# Patient Record
Sex: Male | Born: 1945 | Race: White | Hispanic: No | Marital: Married | Smoking: Former smoker
Health system: Southern US, Community
[De-identification: ages and names within clinical notes are randomized; demographics above are authoritative.]

## PROBLEM LIST (undated history)

## (undated) DIAGNOSIS — Z87442 Personal history of urinary calculi: Secondary | ICD-10-CM

## (undated) DIAGNOSIS — I10 Essential (primary) hypertension: Secondary | ICD-10-CM

## (undated) DIAGNOSIS — K219 Gastro-esophageal reflux disease without esophagitis: Secondary | ICD-10-CM

## (undated) DIAGNOSIS — M5416 Radiculopathy, lumbar region: Secondary | ICD-10-CM

## (undated) DIAGNOSIS — R011 Cardiac murmur, unspecified: Secondary | ICD-10-CM

## (undated) DIAGNOSIS — H9319 Tinnitus, unspecified ear: Secondary | ICD-10-CM

## (undated) DIAGNOSIS — D126 Benign neoplasm of colon, unspecified: Secondary | ICD-10-CM

## (undated) DIAGNOSIS — K2270 Barrett's esophagus without dysplasia: Secondary | ICD-10-CM

## (undated) DIAGNOSIS — K769 Liver disease, unspecified: Secondary | ICD-10-CM

## (undated) DIAGNOSIS — M1990 Unspecified osteoarthritis, unspecified site: Secondary | ICD-10-CM

## (undated) DIAGNOSIS — E785 Hyperlipidemia, unspecified: Secondary | ICD-10-CM

## (undated) DIAGNOSIS — T7840XA Allergy, unspecified, initial encounter: Secondary | ICD-10-CM

## (undated) DIAGNOSIS — K7581 Nonalcoholic steatohepatitis (NASH): Secondary | ICD-10-CM

## (undated) DIAGNOSIS — D696 Thrombocytopenia, unspecified: Secondary | ICD-10-CM

## (undated) DIAGNOSIS — K2210 Ulcer of esophagus without bleeding: Secondary | ICD-10-CM

## (undated) DIAGNOSIS — K7460 Unspecified cirrhosis of liver: Secondary | ICD-10-CM

## (undated) DIAGNOSIS — R7303 Prediabetes: Secondary | ICD-10-CM

## (undated) DIAGNOSIS — Z87891 Personal history of nicotine dependence: Secondary | ICD-10-CM

## (undated) HISTORY — DX: Allergy, unspecified, initial encounter: T78.40XA

## (undated) HISTORY — DX: Benign neoplasm of colon, unspecified: D12.6

## (undated) HISTORY — DX: Hyperlipidemia, unspecified: E78.5

## (undated) HISTORY — DX: Personal history of urinary calculi: Z87.442

## (undated) HISTORY — DX: Tinnitus, unspecified ear: H93.19

## (undated) HISTORY — PX: UPPER GASTROINTESTINAL ENDOSCOPY: SHX188

## (undated) HISTORY — DX: Liver disease, unspecified: K76.9

## (undated) HISTORY — DX: Barrett's esophagus without dysplasia: K22.70

## (undated) HISTORY — DX: Gastro-esophageal reflux disease without esophagitis: K21.9

## (undated) HISTORY — DX: Cardiac murmur, unspecified: R01.1

## (undated) HISTORY — DX: Essential (primary) hypertension: I10

## (undated) HISTORY — DX: Unspecified cirrhosis of liver: K74.60

## (undated) HISTORY — DX: Ulcer of esophagus without bleeding: K22.10

## (undated) HISTORY — DX: Personal history of nicotine dependence: Z87.891

## (undated) HISTORY — DX: Unspecified osteoarthritis, unspecified site: M19.90

## (undated) HISTORY — DX: Thrombocytopenia, unspecified: D69.6

## (undated) HISTORY — DX: Nonalcoholic steatohepatitis (NASH): K75.81

## (undated) HISTORY — DX: Radiculopathy, lumbar region: M54.16

## (undated) HISTORY — DX: Prediabetes: R73.03

---

## 1986-11-29 DIAGNOSIS — M5416 Radiculopathy, lumbar region: Secondary | ICD-10-CM

## 1986-11-29 HISTORY — DX: Radiculopathy, lumbar region: M54.16

## 1993-11-29 DIAGNOSIS — Z87891 Personal history of nicotine dependence: Secondary | ICD-10-CM

## 1993-11-29 HISTORY — DX: Personal history of nicotine dependence: Z87.891

## 1994-11-29 HISTORY — PX: CHOLECYSTECTOMY: SHX55

## 2006-11-29 DIAGNOSIS — Z87442 Personal history of urinary calculi: Secondary | ICD-10-CM

## 2006-11-29 DIAGNOSIS — R7303 Prediabetes: Secondary | ICD-10-CM

## 2006-11-29 HISTORY — DX: Prediabetes: R73.03

## 2006-11-29 HISTORY — DX: Personal history of urinary calculi: Z87.442

## 2011-11-30 HISTORY — PX: COLONOSCOPY: SHX174

## 2012-08-31 DIAGNOSIS — D126 Benign neoplasm of colon, unspecified: Secondary | ICD-10-CM | POA: Diagnosis not present

## 2012-08-31 DIAGNOSIS — K7460 Unspecified cirrhosis of liver: Secondary | ICD-10-CM | POA: Diagnosis not present

## 2012-08-31 DIAGNOSIS — Z860100 Personal history of colon polyps, unspecified: Secondary | ICD-10-CM | POA: Diagnosis not present

## 2012-08-31 DIAGNOSIS — K7689 Other specified diseases of liver: Secondary | ICD-10-CM | POA: Diagnosis not present

## 2013-02-21 DIAGNOSIS — Z125 Encounter for screening for malignant neoplasm of prostate: Secondary | ICD-10-CM | POA: Diagnosis not present

## 2013-02-21 DIAGNOSIS — Z136 Encounter for screening for cardiovascular disorders: Secondary | ICD-10-CM | POA: Diagnosis not present

## 2013-02-21 DIAGNOSIS — E119 Type 2 diabetes mellitus without complications: Secondary | ICD-10-CM | POA: Diagnosis not present

## 2013-02-22 ENCOUNTER — Other Ambulatory Visit: Payer: Self-pay | Admitting: *Deleted

## 2013-02-22 DIAGNOSIS — K7460 Unspecified cirrhosis of liver: Secondary | ICD-10-CM

## 2013-02-22 DIAGNOSIS — E119 Type 2 diabetes mellitus without complications: Secondary | ICD-10-CM | POA: Diagnosis not present

## 2013-02-22 DIAGNOSIS — Z125 Encounter for screening for malignant neoplasm of prostate: Secondary | ICD-10-CM | POA: Diagnosis not present

## 2013-02-22 LAB — COMPREHENSIVE METABOLIC PANEL
ALK PHOS: 48 U/L
ALT: 63
AST: 39 U/L
Albumin: 4.6
Creat: 1.18
GLUCOSE: 110
Potassium: 4.8 mmol/L
Sodium: 140 mmol/L (ref 137–147)
Total Bilirubin: 0.5 mg/dL
Total Protein: 7.1 g/dL

## 2013-02-22 LAB — LIPID PANEL
Cholesterol, Total: 207
HDL: 41 mg/dL (ref 35–70)
LDL (calc): 132
TRIGLYCERIDES: 171

## 2013-02-22 LAB — PSA: PSA: 0.2

## 2013-02-23 ENCOUNTER — Ambulatory Visit
Admission: RE | Admit: 2013-02-23 | Discharge: 2013-02-23 | Disposition: A | Discharge status: Home / Self Care | Payer: Self-pay | Source: Ambulatory Visit | Attending: *Deleted | Admitting: *Deleted

## 2013-02-23 DIAGNOSIS — K7689 Other specified diseases of liver: Secondary | ICD-10-CM | POA: Diagnosis not present

## 2013-02-23 DIAGNOSIS — K7460 Unspecified cirrhosis of liver: Secondary | ICD-10-CM

## 2013-02-26 DIAGNOSIS — I714 Abdominal aortic aneurysm, without rupture: Secondary | ICD-10-CM | POA: Diagnosis not present

## 2013-02-27 DIAGNOSIS — E119 Type 2 diabetes mellitus without complications: Secondary | ICD-10-CM | POA: Diagnosis not present

## 2013-02-27 DIAGNOSIS — K7460 Unspecified cirrhosis of liver: Secondary | ICD-10-CM | POA: Diagnosis not present

## 2013-02-27 DIAGNOSIS — E785 Hyperlipidemia, unspecified: Secondary | ICD-10-CM | POA: Diagnosis not present

## 2013-03-02 DIAGNOSIS — K7460 Unspecified cirrhosis of liver: Secondary | ICD-10-CM | POA: Diagnosis not present

## 2013-03-02 DIAGNOSIS — K7689 Other specified diseases of liver: Secondary | ICD-10-CM | POA: Diagnosis not present

## 2013-03-02 DIAGNOSIS — R932 Abnormal findings on diagnostic imaging of liver and biliary tract: Secondary | ICD-10-CM | POA: Diagnosis not present

## 2013-03-27 DIAGNOSIS — E785 Hyperlipidemia, unspecified: Secondary | ICD-10-CM | POA: Diagnosis not present

## 2013-03-30 DIAGNOSIS — K7460 Unspecified cirrhosis of liver: Secondary | ICD-10-CM | POA: Diagnosis not present

## 2013-03-30 DIAGNOSIS — K921 Melena: Secondary | ICD-10-CM | POA: Diagnosis not present

## 2013-06-05 DIAGNOSIS — E119 Type 2 diabetes mellitus without complications: Secondary | ICD-10-CM | POA: Diagnosis not present

## 2013-06-05 DIAGNOSIS — E785 Hyperlipidemia, unspecified: Secondary | ICD-10-CM | POA: Diagnosis not present

## 2013-06-21 DIAGNOSIS — M533 Sacrococcygeal disorders, not elsewhere classified: Secondary | ICD-10-CM | POA: Diagnosis not present

## 2013-06-21 DIAGNOSIS — IMO0001 Reserved for inherently not codable concepts without codable children: Secondary | ICD-10-CM | POA: Diagnosis not present

## 2013-06-21 DIAGNOSIS — M545 Low back pain, unspecified: Secondary | ICD-10-CM | POA: Diagnosis not present

## 2013-07-18 DIAGNOSIS — Z7189 Other specified counseling: Secondary | ICD-10-CM | POA: Diagnosis not present

## 2013-07-18 DIAGNOSIS — J209 Acute bronchitis, unspecified: Secondary | ICD-10-CM | POA: Diagnosis not present

## 2013-08-29 DIAGNOSIS — Z23 Encounter for immunization: Secondary | ICD-10-CM | POA: Diagnosis not present

## 2013-10-18 DIAGNOSIS — R160 Hepatomegaly, not elsewhere classified: Secondary | ICD-10-CM | POA: Diagnosis not present

## 2013-10-18 DIAGNOSIS — K7460 Unspecified cirrhosis of liver: Secondary | ICD-10-CM | POA: Diagnosis not present

## 2014-01-24 DIAGNOSIS — K7460 Unspecified cirrhosis of liver: Secondary | ICD-10-CM | POA: Diagnosis not present

## 2014-05-15 ENCOUNTER — Telehealth: Payer: Self-pay | Admitting: Gastroenterology

## 2014-05-24 NOTE — Telephone Encounter (Signed)
Records approved.  LM for patient.  He wants Oct appt.  Will call him back when schedule open.  Records in file.

## 2014-06-26 ENCOUNTER — Encounter: Payer: Self-pay | Admitting: Gastroenterology

## 2014-08-29 DIAGNOSIS — K2270 Barrett's esophagus without dysplasia: Secondary | ICD-10-CM

## 2014-08-29 HISTORY — PX: ESOPHAGOGASTRODUODENOSCOPY: SHX1529

## 2014-08-29 HISTORY — DX: Barrett's esophagus without dysplasia: K22.70

## 2014-08-30 DIAGNOSIS — Z23 Encounter for immunization: Secondary | ICD-10-CM | POA: Diagnosis not present

## 2014-09-13 ENCOUNTER — Encounter: Payer: Self-pay | Admitting: Gastroenterology

## 2014-09-18 ENCOUNTER — Encounter: Payer: Self-pay | Admitting: Gastroenterology

## 2014-09-18 ENCOUNTER — Other Ambulatory Visit (INDEPENDENT_AMBULATORY_CARE_PROVIDER_SITE_OTHER): Payer: Medicare Other

## 2014-09-18 ENCOUNTER — Ambulatory Visit (INDEPENDENT_AMBULATORY_CARE_PROVIDER_SITE_OTHER): Payer: Medicare Other | Admitting: Gastroenterology

## 2014-09-18 VITALS — BP 142/82 | HR 64 | Ht 69.5 in | Wt 221.5 lb

## 2014-09-18 DIAGNOSIS — K7460 Unspecified cirrhosis of liver: Secondary | ICD-10-CM | POA: Diagnosis not present

## 2014-09-18 DIAGNOSIS — K7581 Nonalcoholic steatohepatitis (NASH): Secondary | ICD-10-CM | POA: Diagnosis not present

## 2014-09-18 DIAGNOSIS — K2270 Barrett's esophagus without dysplasia: Secondary | ICD-10-CM | POA: Insufficient documentation

## 2014-09-18 DIAGNOSIS — K7469 Other cirrhosis of liver: Secondary | ICD-10-CM | POA: Insufficient documentation

## 2014-09-18 DIAGNOSIS — Z86010 Personal history of colonic polyps: Secondary | ICD-10-CM

## 2014-09-18 HISTORY — DX: Nonalcoholic steatohepatitis (NASH): K75.81

## 2014-09-18 LAB — COMPREHENSIVE METABOLIC PANEL
ALT: 42 U/L (ref 0–53)
AST: 30 U/L (ref 0–37)
Albumin: 4 g/dL (ref 3.5–5.2)
Alkaline Phosphatase: 45 U/L (ref 39–117)
BUN: 24 mg/dL — ABNORMAL HIGH (ref 6–23)
CO2: 31 mEq/L (ref 19–32)
Calcium: 9.9 mg/dL (ref 8.4–10.5)
Chloride: 104 mEq/L (ref 96–112)
Creatinine, Ser: 1.3 mg/dL (ref 0.4–1.5)
GFR: 61.11 mL/min (ref >60.00)
Glucose, Bld: 103 mg/dL — ABNORMAL HIGH (ref 70–99)
POTASSIUM: 4.3 meq/L (ref 3.5–5.1)
SODIUM: 138 meq/L (ref 135–145)
TOTAL PROTEIN: 8.1 g/dL (ref 6.0–8.3)
Total Bilirubin: 1 mg/dL (ref 0.2–1.2)

## 2014-09-18 LAB — CBC WITH DIFFERENTIAL/PLATELET
BASOS PCT: 0.2 % (ref 0.0–3.0)
Basophils Absolute: 0 10*3/uL (ref 0.0–0.1)
EOS PCT: 3.4 % (ref 0.0–5.0)
Eosinophils Absolute: 0.2 10*3/uL (ref 0.0–0.7)
HCT: 43.4 % (ref 39.0–52.0)
Hemoglobin: 14.7 g/dL (ref 13.0–17.0)
LYMPHS PCT: 17.8 % (ref 12.0–46.0)
Lymphs Abs: 0.9 10*3/uL (ref 0.7–4.0)
MCHC: 33.8 g/dL (ref 30.0–36.0)
MCV: 87.8 fl (ref 78.0–100.0)
Monocytes Absolute: 0.5 10*3/uL (ref 0.1–1.0)
Monocytes Relative: 9 % (ref 3.0–12.0)
Neutro Abs: 3.5 10*3/uL (ref 1.4–7.7)
Neutrophils Relative %: 69.6 % (ref 43.0–77.0)
Platelets: 121 10*3/uL — ABNORMAL LOW (ref 150.0–400.0)
RBC: 4.95 Mil/uL (ref 4.22–5.81)
RDW: 14.4 % (ref 11.5–15.5)
WBC: 5.1 10*3/uL (ref 4.0–10.5)

## 2014-09-18 LAB — PROTIME-INR
INR: 1.1 ratio — ABNORMAL HIGH (ref 0.8–1.0)
Prothrombin Time: 12.3 s (ref 9.6–13.1)

## 2014-09-18 MED ORDER — OMEPRAZOLE 20 MG PO CPDR: 20.0000 mg | DELAYED_RELEASE_CAPSULE | Freq: Every day | ORAL | Status: DC

## 2014-09-18 NOTE — Assessment & Plan Note (Signed)
Prior endoscopy 2013.  Barrett's will be checked again while undergoing an endoscopy for surveillance of varices.  Plan to continue omeprazole.

## 2014-09-18 NOTE — Patient Instructions (Signed)
Go to the basement for labs today  You have been scheduled for an endoscopy. Please follow written instructions given to you at your visit today. If you use inhalers (even only as needed), please bring them with you on the day of your procedure. Your physician has requested that you go to www.startemmi.com and enter the access code given to you at your visit today. This web site gives a general overview about your procedure. However, you should still follow specific instructions given to you by our office regarding your preparation for the procedure. Your prescription will be renewed  (Omeprazole)

## 2014-09-18 NOTE — Progress Notes (Signed)
_                                                                                                                History of Present Illness:  Jeffery Pruitt 68 year old with [name] cirrhosis.  Adenomatous colon polyps were removed in 2013.  Barrett's esophagus was seen in 2013 as well.  In February, 2015 LFTs were normal.  Alpha-fetoprotein was 2.6.  In December, 2014 platelets were 123 and hemoglobin 14.3.  Ultrasound one year ago demonstrated hepatomegaly with hepatic steatosis.  Liver biopsy in 2009 demonstrated early cirrhotic parenchyma with prominent steatosis.  Patient's no GI complaints including abdominal pain or weight loss.  The only symptom of liver disease is thrombocytopenia.  He is received vaccinations for hepatitis A and B. and Pneumovax.    Past Medical History  Diagnosis Date  . NASH (nonalcoholic steatohepatitis)   . Cirrhosis   . Adenomatous colon polyp 2009 & 2013  . Barrett's esophagus   . Internal hemorrhoids   . Hyperplastic colon polyp   . Erosive esophagitis   . Chronic liver disease   . Gallstones 1996  . Hyperlipidemia   . Kidney stone 2008   Past Surgical History  Procedure Laterality Date  . Cholecystectomy  1996   family history includes Colon polyps in his father. There is no history of Colon cancer, Diabetes, Kidney disease, or Esophageal cancer. Current Outpatient Prescriptions  Medication Sig Dispense Refill  . atorvastatin (LIPITOR) 10 MG tablet Take 10 mg by mouth daily.      Marland Kitchen omeprazole (PRILOSEC) 20 MG capsule Take 20 mg by mouth daily.       No current facility-administered medications for this visit.   Allergies as of 09/18/2014  . (No Known Allergies)    reports that he quit smoking about 26 years ago. His smoking use included Cigarettes. He has a 40 pack-year smoking history. He has never used smokeless tobacco. He reports that he does not drink alcohol or use illicit drugs.   Review of Systems: Pertinent positive and  negative review of systems were noted in the above HPI section. All other review of systems were otherwise negative.  Vital signs were reviewed in today's medical record Physical Exam: General: Well developed , well nourished, no acute distress Skin: anicteric Head: Normocephalic and atraumatic Eyes:  sclerae anicteric, EOMI Ears: Normal auditory acuity Mouth: No deformity or lesions Neck: Supple, no masses or thyromegaly Lungs: Clear throughout to auscultation Heart: Regular rate and rhythm; no murmurs, rubs or bruits Abdomen: Soft, non tender and non distended. No masses, hepatosplenomegaly or hernias noted. Normal Bowel sounds Rectal:deferred Musculoskeletal: Symmetrical with no gross deformities  Skin: No lesions on visible extremities Pulses:  Normal pulses noted Extremities: No clubbing, cyanosis, edema or deformities noted Neurological: Alert oriented x 4, grossly nonfocal Cervical Nodes:  No significant cervical adenopathy Inguinal Nodes: No significant inguinal adenopathy Psychological:  Alert and cooperative. Normal mood and affect  See Assessment and Plan under Problem List

## 2014-09-18 NOTE — Assessment & Plan Note (Signed)
Excellent synthetic function.  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  Recommendations #1 upper endoscopy for surveillance of varices

## 2014-09-18 NOTE — Assessment & Plan Note (Signed)
Plan followup colonoscopy 2018 

## 2014-09-19 LAB — AFP TUMOR MARKER: AFP-Tumor Marker: 3.3 ng/mL (ref <6.1)

## 2014-09-23 ENCOUNTER — Ambulatory Visit (AMBULATORY_SURGERY_CENTER): Payer: Medicare Other | Admitting: Gastroenterology

## 2014-09-23 ENCOUNTER — Encounter: Payer: Self-pay | Admitting: Gastroenterology

## 2014-09-23 VITALS — BP 109/78 | HR 49 | Temp 97.4°F | Resp 14 | Ht 69.0 in | Wt 221.0 lb

## 2014-09-23 DIAGNOSIS — K2270 Barrett's esophagus without dysplasia: Secondary | ICD-10-CM

## 2014-09-23 DIAGNOSIS — K219 Gastro-esophageal reflux disease without esophagitis: Secondary | ICD-10-CM

## 2014-09-23 DIAGNOSIS — K7460 Unspecified cirrhosis of liver: Secondary | ICD-10-CM | POA: Diagnosis not present

## 2014-09-23 DIAGNOSIS — E669 Obesity, unspecified: Secondary | ICD-10-CM | POA: Diagnosis not present

## 2014-09-23 MED ORDER — SODIUM CHLORIDE 0.9 % IV SOLN: 500.0000 mL | INTRAVENOUS | Status: DC

## 2014-09-23 NOTE — Progress Notes (Signed)
Called to room to assist during endoscopic procedure.  Patient ID and intended procedure confirmed with present staff. Received instructions for my participation in the procedure from the performing physician.  

## 2014-09-23 NOTE — Patient Instructions (Signed)
YOU HAD AN ENDOSCOPIC PROCEDURE TODAY AT [facility]: Refer to the procedure report that was given to you for any specific questions about what was found during the examination.  If the procedure report does not answer your questions, please call your gastroenterologist to clarify.  If you requested that your care partner not be given the details of your procedure findings, then the procedure report has been included in a sealed envelope for you to review at your convenience later.  YOU SHOULD EXPECT: Some feelings of bloating in the abdomen. Passage of more gas than usual.  Walking can help get rid of the air that was put into your GI tract during the procedure and reduce the bloating. If you had a lower endoscopy (such as a colonoscopy or flexible sigmoidoscopy) you may notice spotting of blood in your stool or on the toilet paper. If you underwent a bowel prep for your procedure, then you may not have a normal bowel movement for a few days.  DIET: Your first meal following the procedure should be a light meal and then it is ok to progress to your normal diet.  A half-sandwich or bowl of soup is an example of a good first meal.  Heavy or fried foods are harder to digest and may make you feel nauseous or bloated.  Likewise meals heavy in dairy and vegetables can cause extra gas to form and this can also increase the bloating.  Drink plenty of fluids but you should avoid alcoholic beverages for 24 hours.  ACTIVITY: Your care partner should take you home directly after the procedure.  You should plan to take it easy, moving slowly for the rest of the day.  You can resume normal activity the day after the procedure however you should NOT DRIVE or use heavy machinery for 24 hours (because of the sedation medicines used during the test).    SYMPTOMS TO REPORT IMMEDIATELY: A gastroenterologist can be reached at any hour.  During normal business hours, 8:30 AM to 5:00 PM Monday through Friday,  call [phone].  After hours and on weekends, please call the GI answering service at [phone] who will take a message and have the physician on call contact you.    Following upper endoscopy (EGD)  Vomiting of blood or coffee ground material  New chest pain or pain under the shoulder blades  Painful or persistently difficult swallowing  New shortness of breath  Fever of 100F or higher  Black, tarry-looking stools  FOLLOW UP: If any biopsies were taken you will be contacted by phone or by letter within the next 1-3 weeks.  Call your gastroenterologist if you have not heard about the biopsies in 3 weeks.  Our staff will call the home number listed on your records the next business day following your procedure to check on you and address any questions or concerns that you may have at that time regarding the information given to you following your procedure. This is a courtesy call and so if there is no answer at the home number and we have not heard from you through the emergency physician on call, we will assume that you have returned to your regular daily activities without incident.  SIGNATURES/CONFIDENTIALITY: You and/or your care partner have signed paperwork which will be entered into your electronic medical record.  These signatures attest to the fact that that the information above on your After Visit Summary has been reviewed and is understood.  Full  responsibility of the confidentiality of this discharge information lies with you and/or your care-partner.  Wait biopsy results.  Repeat EGD in 1 year-2016.

## 2014-09-23 NOTE — Op Note (Signed)
[facility]  [address] [place], [id number]   ENDOSCOPY PROCEDURE REPORT  PATIENT: Jeffery, Pruitt  MR#: [id number] BIRTHDATE: 08/22/1946 , 11  yrs. old GENDER: male ENDOSCOPIST: Inda Castle, MD REFERRED BY:  Ria Bush, M.D. PROCEDURE DATE:  09/23/2014 PROCEDURE:  EGD w/ biopsy ASA CLASS:     Class II INDICATIONS:  history of Barrett's esophagus and screening for varices, h/o NASH MEDICATIONS: Monitored anesthesia care and Propofol 200 mg IV TOPICAL ANESTHETIC:  DESCRIPTION OF PROCEDURE: After the risks benefits and alternatives of the procedure were thoroughly explained, informed consent was obtained.  The LB [id number] endoscope was introduced through the mouth and advanced to the second portion of the duodenum , Without limitations.  The instrument was slowly withdrawn as the mucosa was fully examined.    ESOPHAGUS: There was evidence of Barrett's esophagus without dysplasia found at the gastroesophageal junction.  The length of circumferential Barrett's was 1cm ([place] C1).  Irregular GE junction.  Multiple biopsies were performed using cold forceps. NIX_THIS Description: NIX_THIS.   NIX_THIS NIX_THIS NIX_THIS Otherwise normal EGD  Retroflexed views revealed no abnormalities. The scope was then withdrawn from the patient and the procedure completed.  COMPLICATIONS: There were no immediate complications.  ENDOSCOPIC IMPRESSION: 1.   There was Barrett's esophagus w/o dysplasia found at the gastroesophageal junction; multiple biopsies were performed 2.   Otherwise normal EGD  RECOMMENDATIONS: Await biopsy results  REPEAT EXAM: 1 year eSigned:  Inda Castle, MD 09/23/2014 8:48 AM    CC:

## 2014-09-23 NOTE — Progress Notes (Signed)
Procedure ends, to recovery, report given and VSS. 

## 2014-09-24 ENCOUNTER — Ambulatory Visit (INDEPENDENT_AMBULATORY_CARE_PROVIDER_SITE_OTHER): Payer: Medicare Other | Admitting: Family Medicine

## 2014-09-24 ENCOUNTER — Encounter: Payer: Self-pay | Admitting: Family Medicine

## 2014-09-24 ENCOUNTER — Telehealth: Payer: Self-pay | Admitting: *Deleted

## 2014-09-24 VITALS — BP 138/82 | HR 68 | Temp 98.2°F | Ht 69.6 in | Wt 224.2 lb

## 2014-09-24 DIAGNOSIS — R7303 Prediabetes: Secondary | ICD-10-CM

## 2014-09-24 DIAGNOSIS — E785 Hyperlipidemia, unspecified: Secondary | ICD-10-CM

## 2014-09-24 DIAGNOSIS — M79603 Pain in arm, unspecified: Secondary | ICD-10-CM | POA: Insufficient documentation

## 2014-09-24 DIAGNOSIS — M5416 Radiculopathy, lumbar region: Secondary | ICD-10-CM | POA: Insufficient documentation

## 2014-09-24 DIAGNOSIS — M79601 Pain in right arm: Secondary | ICD-10-CM | POA: Diagnosis not present

## 2014-09-24 DIAGNOSIS — K7581 Nonalcoholic steatohepatitis (NASH): Secondary | ICD-10-CM

## 2014-09-24 DIAGNOSIS — K7469 Other cirrhosis of liver: Secondary | ICD-10-CM

## 2014-09-24 DIAGNOSIS — M79604 Pain in right leg: Secondary | ICD-10-CM

## 2014-09-24 DIAGNOSIS — Z23 Encounter for immunization: Secondary | ICD-10-CM

## 2014-09-24 DIAGNOSIS — R7309 Other abnormal glucose: Secondary | ICD-10-CM

## 2014-09-24 DIAGNOSIS — K2270 Barrett's esophagus without dysplasia: Secondary | ICD-10-CM

## 2014-09-24 MED ORDER — ATORVASTATIN CALCIUM 10 MG PO TABS: 10.0000 mg | ORAL_TABLET | Freq: Every day | ORAL | Status: DC

## 2014-09-24 NOTE — Assessment & Plan Note (Signed)
Has established with [facility] appreciate their care.

## 2014-09-24 NOTE — Addendum Note (Signed)
Addended by: Royann Shivers A on: 09/24/2014 03:51 PM   Modules accepted: Orders

## 2014-09-24 NOTE — Telephone Encounter (Signed)
  Follow up Call-  Call back number 09/23/2014  Post procedure Call Back phone  # [phone]  Permission to leave phone message Yes     Patient questions:  Do you have a fever, pain , or abdominal swelling? No. Pain Score  0 *  Have you tolerated food without any problems? Yes.    Have you been able to return to your normal activities? Yes.    Do you have any questions about your discharge instructions: Diet   No. Medications  No. Follow up visit  No.  Do you have questions or concerns about your Care? No.  Actions: * If pain score is 4 or above: No action needed, pain <4.

## 2014-09-24 NOTE — Progress Notes (Signed)
BP 138/82  Pulse 68  Temp(Src) 98.2 F (36.8 C) (Oral)  Ht 5' 9.6" (1.768 m)  Wt 224 lb 4 oz (101.719 kg)  BMI 32.54 kg/m2   CC: new pt to etablish care  Subjective:    Patient ID: Jeffery Pruitt, male    DOB: 02-23-1946, 68 y.o.   MRN: [id number]  HPI: Jeffery Pruitt is a 68 y.o. male presenting on 09/24/2014 for Establish Care   Prior saw MD in [place] then Dr Heber Sawyer for 1-2 years then she moved.   Recently established with Dr [facility] for NASH cirrhosis dx by biopsy 2009. Mild thrombocytopenia otherwise no other clinical or symptomatic liver manifestations. H/o alcohol use.   HLD - tolerating lipitor well without myalgias.   H/o diabetes type 2 - down to prediabetes after healthy diet/lifestyle changes.   Barrett's - s/p EGD yesterday, pending biopsy pathology.   R elbow pain - Stopped raquet sports. Ongoing for 8 months. Slowly improving.  Preventative: Last CPE 1.5 yrs ago Colonoscopy 2013 colon polyps, rpt due 5 yrs.  Prostate screening - gets this checked yearly.  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  Allergies and medications reviewed and updated. Current Outpatient Prescriptions on File Prior to Visit  Medication Sig  . omeprazole (PRILOSEC) 20 MG capsule Take 1 capsule (20 mg total) by mouth daily.   No current facility-administered medications on file prior to visit.    Review of Systems Per HPI unless specifically indicated above    Objective:    BP 138/82  Pulse 68  Temp(Src) 98.2 F (36.8 C) (Oral)  Ht 5' 9.6" (1.768 m)  Wt 224 lb 4 oz (101.719 kg)  BMI 32.54 kg/m2  Physical Exam  Nursing note  reviewed. Constitutional: He appears well-developed and well-nourished. No distress.  HENT:  Head: Normocephalic and atraumatic.  Mouth/Throat: Oropharynx is clear and moist. No oropharyngeal exudate.  Eyes: Conjunctivae and EOM are normal. Pupils are equal, round, and reactive to light. No scleral icterus.  Neck: Normal range of motion. Neck supple. No thyromegaly present.  Cardiovascular: Normal rate, regular rhythm, normal heart sounds and intact distal pulses.   No murmur heard. Pulmonary/Chest: Effort normal and breath sounds normal. No respiratory distress. He has no wheezes. He has no rales.  Abdominal: Soft. Bowel sounds are normal. He exhibits no distension and no mass. There is no tenderness. There is no rebound and no guarding.  Musculoskeletal: He exhibits no edema.  L arm WNL R arm - tender at insertion of distal triceps tendon, no pain at lateral or medial epicondyle. FROM at elbow flexion/extension  Lymphadenopathy:    He has no cervical adenopathy.  Skin: Skin is warm and dry. No rash noted.  Psychiatric: He has a normal mood and affect.       Assessment & Plan:   Problem List Items Addressed This Visit   Prediabetes     Check A1c next visit at medicare wellness visit.    NASH (nonalcoholic steatohepatitis)     Has established with [facility] appreciate their care.    Hyperlipidemia     Stable, tolerating lipitor. Check FLP next fasting labwork    Relevant Medications  atorvastatin (LIPITOR) tablet   Cirrhosis     Overall stable course - monitored closely by GI    Barrett's esophagus     S/p EGD yesterday - pending biopsy pathology. Compliant with low dose PPI daily.    Arm pain, posterior - Primary     Anticipate triceps tendinopathy - treat with aleve prn, ice/heating pad, and provided with stretching exercises from [facility] pt advisor. If not improved, suggested eval by SM Dr Lorelei Pont. Pt agrees with plan.        Follow up plan: Return in about 6  months (around 03/26/2015), or as needed, for medicare wellness visit.

## 2014-09-24 NOTE — Assessment & Plan Note (Signed)
Overall stable course - monitored closely by GI

## 2014-09-24 NOTE — Assessment & Plan Note (Signed)
Stable, tolerating lipitor. Check FLP next fasting labwork

## 2014-09-24 NOTE — Assessment & Plan Note (Signed)
Anticipate triceps tendinopathy - treat with aleve prn, ice/heating pad, and provided with stretching exercises from [facility] pt advisor. If not improved, suggested eval by SM Dr Lorelei Pont. Pt agrees with plan.

## 2014-09-24 NOTE — Assessment & Plan Note (Signed)
Check A1c next visit at medicare wellness visit.

## 2014-09-24 NOTE — Progress Notes (Signed)
Pre visit review using our clinic review tool, if applicable. No additional management support is needed unless otherwise documented below in the visit note. 

## 2014-09-24 NOTE — Patient Instructions (Addendum)
Prevnar today (pneumonia shot). For right arm - I wonder about tendonitis - treat with aleve as needed, ice or heating pad. Let me know if persistent pain. Good to see you today, call us with questions. Return as needed or in 6 months for wellness visit

## 2014-09-24 NOTE — Assessment & Plan Note (Addendum)
S/p EGD yesterday - pending biopsy pathology. Compliant with low dose PPI daily.

## 2014-09-25 ENCOUNTER — Encounter: Payer: Self-pay | Admitting: *Deleted

## 2014-09-25 DIAGNOSIS — H2513 Age-related nuclear cataract, bilateral: Secondary | ICD-10-CM | POA: Diagnosis not present

## 2014-09-27 ENCOUNTER — Encounter: Payer: Self-pay | Admitting: Gastroenterology

## 2014-09-30 ENCOUNTER — Encounter: Payer: Self-pay | Admitting: Family Medicine

## 2015-03-06 ENCOUNTER — Other Ambulatory Visit: Payer: Self-pay | Admitting: Family Medicine

## 2015-03-06 ENCOUNTER — Other Ambulatory Visit (INDEPENDENT_AMBULATORY_CARE_PROVIDER_SITE_OTHER): Payer: Medicare Other

## 2015-03-06 DIAGNOSIS — K7581 Nonalcoholic steatohepatitis (NASH): Secondary | ICD-10-CM

## 2015-03-06 DIAGNOSIS — Z125 Encounter for screening for malignant neoplasm of prostate: Secondary | ICD-10-CM

## 2015-03-06 DIAGNOSIS — K7469 Other cirrhosis of liver: Secondary | ICD-10-CM | POA: Diagnosis not present

## 2015-03-06 DIAGNOSIS — R7309 Other abnormal glucose: Secondary | ICD-10-CM | POA: Diagnosis not present

## 2015-03-06 DIAGNOSIS — E785 Hyperlipidemia, unspecified: Secondary | ICD-10-CM | POA: Diagnosis not present

## 2015-03-06 DIAGNOSIS — R7303 Prediabetes: Secondary | ICD-10-CM

## 2015-03-06 LAB — COMPREHENSIVE METABOLIC PANEL
ALK PHOS: 46 U/L (ref 39–117)
ALT: 83 U/L — ABNORMAL HIGH (ref 0–53)
AST: 41 U/L — ABNORMAL HIGH (ref 0–37)
Albumin: 4.5 g/dL (ref 3.5–5.2)
BUN: 23 mg/dL (ref 6–23)
CHLORIDE: 105 meq/L (ref 96–112)
CO2: 30 mEq/L (ref 19–32)
CREATININE: 1.19 mg/dL (ref 0.40–1.50)
Calcium: 9.6 mg/dL (ref 8.4–10.5)
GFR: 64.59 mL/min (ref >60.00)
Glucose, Bld: 110 mg/dL — ABNORMAL HIGH (ref 70–99)
Potassium: 4.4 mEq/L (ref 3.5–5.1)
Sodium: 141 mEq/L (ref 135–145)
Total Bilirubin: 0.7 mg/dL (ref 0.2–1.2)
Total Protein: 7.3 g/dL (ref 6.0–8.3)

## 2015-03-06 LAB — CBC WITH DIFFERENTIAL/PLATELET
BASOS PCT: 0.3 % (ref 0.0–3.0)
Basophils Absolute: 0 10*3/uL (ref 0.0–0.1)
EOS PCT: 3.4 % (ref 0.0–5.0)
Eosinophils Absolute: 0.1 10*3/uL (ref 0.0–0.7)
HEMATOCRIT: 43 % (ref 39.0–52.0)
Hemoglobin: 14.8 g/dL (ref 13.0–17.0)
Lymphocytes Relative: 24.6 % (ref 12.0–46.0)
Lymphs Abs: 0.9 10*3/uL (ref 0.7–4.0)
MCHC: 34.3 g/dL (ref 30.0–36.0)
MCV: 86.3 fl (ref 78.0–100.0)
Monocytes Absolute: 0.4 10*3/uL (ref 0.1–1.0)
Monocytes Relative: 9.2 % (ref 3.0–12.0)
NEUTROS PCT: 62.5 % (ref 43.0–77.0)
Neutro Abs: 2.4 10*3/uL (ref 1.4–7.7)
Platelets: 107 10*3/uL — ABNORMAL LOW (ref 150.0–400.0)
RBC: 4.99 Mil/uL (ref 4.22–5.81)
RDW: 14.3 % (ref 11.5–15.5)
WBC: 3.8 10*3/uL — ABNORMAL LOW (ref 4.0–10.5)

## 2015-03-06 LAB — PSA, MEDICARE: PSA: 0.19 ng/ml (ref 0.10–4.00)

## 2015-03-06 LAB — PROTIME-INR
INR: 1 ratio (ref 0.8–1.0)
PROTHROMBIN TIME: 11.5 s (ref 9.6–13.1)

## 2015-03-06 LAB — LIPID PANEL
Cholesterol: 218 mg/dL — ABNORMAL HIGH (ref 0–200)
HDL: 40 mg/dL (ref >39.00)
LDL Cholesterol: 143 mg/dL — ABNORMAL HIGH (ref 0–99)
NONHDL: 178
Total CHOL/HDL Ratio: 5
Triglycerides: 173 mg/dL — ABNORMAL HIGH (ref 0.0–149.0)
VLDL: 34.6 mg/dL (ref 0.0–40.0)

## 2015-03-06 LAB — TSH: TSH: 3.92 u[IU]/mL (ref 0.35–4.50)

## 2015-03-06 LAB — HEMOGLOBIN A1C: Hgb A1c MFr Bld: 6.1 % (ref 4.6–6.5)

## 2015-03-26 ENCOUNTER — Ambulatory Visit (INDEPENDENT_AMBULATORY_CARE_PROVIDER_SITE_OTHER): Payer: Medicare Other | Admitting: Family Medicine

## 2015-03-26 ENCOUNTER — Encounter: Payer: Self-pay | Admitting: Family Medicine

## 2015-03-26 VITALS — BP 124/76 | HR 60 | Temp 97.5°F | Ht 70.0 in | Wt 220.0 lb

## 2015-03-26 DIAGNOSIS — E785 Hyperlipidemia, unspecified: Secondary | ICD-10-CM | POA: Diagnosis not present

## 2015-03-26 DIAGNOSIS — K2270 Barrett's esophagus without dysplasia: Secondary | ICD-10-CM

## 2015-03-26 DIAGNOSIS — K7581 Nonalcoholic steatohepatitis (NASH): Secondary | ICD-10-CM

## 2015-03-26 DIAGNOSIS — Z7189 Other specified counseling: Secondary | ICD-10-CM | POA: Diagnosis not present

## 2015-03-26 DIAGNOSIS — M79604 Pain in right leg: Secondary | ICD-10-CM

## 2015-03-26 DIAGNOSIS — K7469 Other cirrhosis of liver: Secondary | ICD-10-CM

## 2015-03-26 DIAGNOSIS — Z0000 Encounter for general adult medical examination without abnormal findings: Secondary | ICD-10-CM

## 2015-03-26 DIAGNOSIS — R7303 Prediabetes: Secondary | ICD-10-CM

## 2015-03-26 DIAGNOSIS — R7309 Other abnormal glucose: Secondary | ICD-10-CM | POA: Diagnosis not present

## 2015-03-26 DIAGNOSIS — M79601 Pain in right arm: Secondary | ICD-10-CM

## 2015-03-26 DIAGNOSIS — Z23 Encounter for immunization: Secondary | ICD-10-CM | POA: Diagnosis not present

## 2015-03-26 DIAGNOSIS — M722 Plantar fascial fibromatosis: Secondary | ICD-10-CM

## 2015-03-26 NOTE — Assessment & Plan Note (Signed)
Advanced directives - has at home. Wife is HCPOA. Asked to bring me a copy.

## 2015-03-26 NOTE — Progress Notes (Signed)
Pre visit review using our clinic review tool, if applicable. No additional management support is needed unless otherwise documented below in the visit note. 

## 2015-03-26 NOTE — Assessment & Plan Note (Signed)

## 2015-03-26 NOTE — Assessment & Plan Note (Signed)
Check FLP in 3-4 mo once more regular with lipitor. Discussed PM administration.

## 2015-03-26 NOTE — Assessment & Plan Note (Signed)
Discussed A1c - pt will continue to work towards healthy low carb/sugar diet.

## 2015-03-26 NOTE — Addendum Note (Signed)
Addended by: Emelia Salisbury C on: 03/26/2015 12:18 PM   Modules accepted: Orders

## 2015-03-26 NOTE — Assessment & Plan Note (Signed)
Followed by [facility]. Appreciate their care. Reviewed labwork. Mild transaminitis with mild leukopenia and chronic thrombocytopenia.

## 2015-03-26 NOTE — Assessment & Plan Note (Signed)
Followed regularly by GI. Last EGD 08/2014.

## 2015-03-26 NOTE — Progress Notes (Signed)
BP 124/76 mmHg  Pulse 60  Temp(Src) 97.5 F (36.4 C) (Oral)  Ht 5' 10"  (1.778 m)  Wt 220 lb (99.791 kg)  BMI 31.57 kg/m2   CC: medicare wellness visit  Subjective:    Patient ID: Jeffery Pruitt, male    DOB: July 31, 1946, 69 y.o.   MRN: [id number]  HPI: Jeffery Pruitt is a 69 y.o. male presenting on 03/26/2015 for Annual Exam   Ran out of chol meds 2 wks prior to recent labs.  Omeprazole occasionally causes diarrhea so he stops med for a few days. H/o barrett's and NASH followed by GI. Persistent R elbow pain - more noticeable when doing repetitive exercises such as hammering. Pain in soles R>L - especially noted after prolonged standing.   Vision screen at eye doctor annually. Hearing screen passed [place] - none in past year Depression screen - passed  Preventative: Colonoscopy 2013 colon polyps, rpt due 5 yrs.  Prostate screening - gets this checked yearly.  Flu shot 08/2014 Pneumovax 2010, prevnar 08/2014 Td ~2009 zostavax - today  Advanced directives - has at home. Wife is HCPOA. Asked to bring me a copy. Seat belt use discussed Sunscreen use discussed. No changing moles.   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 Interim medical history since our last visit reviewed. Allergies and medications reviewed and updated. Current Outpatient Prescriptions on File Prior to Visit  Medication Sig  . omeprazole (PRILOSEC) 20 MG capsule Take 1 capsule (20 mg total) by mouth daily.   No current facility-administered medications on file prior to visit.    Review of Systems Per HPI unless specifically indicated above     Objective:    BP 124/76 mmHg  Pulse 60  Temp(Src) 97.5 F (36.4 C) (Oral)  Ht 5' 10"   (1.778 m)  Wt 220 lb (99.791 kg)  BMI 31.57 kg/m2  Wt Readings from Last 3 Encounters:  03/26/15 220 lb (99.791 kg)  09/24/14 224 lb 4 oz (101.719 kg)  09/23/14 221 lb (100.245 kg)    Physical Exam  Constitutional: He is oriented to person, place, and time. He appears well-developed and well-nourished. No distress.  HENT:  Head: Normocephalic and atraumatic.  Right Ear: Hearing, tympanic membrane, external ear and ear canal normal.  Left Ear: Hearing, tympanic membrane, external ear and ear canal normal.  Nose: Nose normal.  Mouth/Throat: Uvula is midline, oropharynx is clear and moist and mucous membranes are normal. No oropharyngeal exudate, posterior oropharyngeal edema or posterior oropharyngeal erythema.  Eyes: Conjunctivae and EOM are normal. Pupils are equal, round, and reactive to light. No scleral icterus.  Neck: Normal range of motion. Neck supple. Carotid bruit is not present. No thyromegaly present.  Cardiovascular: Normal rate, regular rhythm, normal heart sounds and intact distal pulses.   No murmur heard. Pulses:      Radial pulses are 2+ on the right side, and 2+ on the left side.  Pulmonary/Chest: Effort normal and breath sounds normal. No respiratory distress. He has no wheezes. He has no rales.  Abdominal: Soft. Bowel sounds are normal. He exhibits no distension and no mass. There is no tenderness. There is no rebound and no guarding.  Genitourinary: Rectum normal and prostate normal. Rectal exam shows no external hemorrhoid, no internal hemorrhoid, no fissure, no  mass, no tenderness and anal tone normal. Prostate is not enlarged (20gm) and not tender.  Musculoskeletal: Normal range of motion. He exhibits no edema.  Tender to palpation bilateral plantar fascia R>L No pain at R lateral epicondyle. Mildly tender to palpation with some thickening evident at lateral triceps  Lymphadenopathy:    He has no cervical adenopathy.  Neurological: He is alert and oriented to  person, place, and time.  CN grossly intact, station and gait intact Recall 3/3 Calculation 5/5 serial 5s  Skin: Skin is warm and dry. No rash noted.  Psychiatric: He has a normal mood and affect. His behavior is normal. Judgment and thought content normal.  Nursing note and vitals reviewed.  Results for orders placed or performed in visit on 03/06/15  Lipid panel  Result Value Ref Range   Cholesterol 218 (H) 0 - 200 mg/dL   Triglycerides 173.0 (H) 0.0 - 149.0 mg/dL   HDL 40.00 >39.00 mg/dL   VLDL 34.6 0.0 - 40.0 mg/dL   LDL Cholesterol 143 (H) 0 - 99 mg/dL   Total CHOL/HDL Ratio 5    NonHDL 178.00   Comprehensive metabolic panel  Result Value Ref Range   Sodium 141 135 - 145 mEq/L   Potassium 4.4 3.5 - 5.1 mEq/L   Chloride 105 96 - 112 mEq/L   CO2 30 19 - 32 mEq/L   Glucose, Bld 110 (H) 70 - 99 mg/dL   BUN 23 6 - 23 mg/dL   Creatinine, Ser 1.19 0.40 - 1.50 mg/dL   Total Bilirubin 0.7 0.2 - 1.2 mg/dL   Alkaline Phosphatase 46 39 - 117 U/L   AST 41 (H) 0 - 37 U/L   ALT 83 (H) 0 - 53 U/L   Total Protein 7.3 6.0 - 8.3 g/dL   Albumin 4.5 3.5 - 5.2 g/dL   Calcium 9.6 8.4 - 10.5 mg/dL   GFR 64.59 >60.00 mL/min  TSH  Result Value Ref Range   TSH 3.92 0.35 - 4.50 uIU/mL  CBC with Differential/Platelet  Result Value Ref Range   WBC 3.8 (L) 4.0 - 10.5 K/uL   RBC 4.99 4.22 - 5.81 Mil/uL   Hemoglobin 14.8 13.0 - 17.0 g/dL   HCT 43.0 39.0 - 52.0 %   MCV 86.3 78.0 - 100.0 fl   MCHC 34.3 30.0 - 36.0 g/dL   RDW 14.3 11.5 - 15.5 %   Platelets 107.0 (L) 150.0 - 400.0 K/uL   Neutrophils Relative % 62.5 43.0 - 77.0 %   Lymphocytes Relative 24.6 12.0 - 46.0 %   Monocytes Relative 9.2 3.0 - 12.0 %   Eosinophils Relative 3.4 0.0 - 5.0 %   Basophils Relative 0.3 0.0 - 3.0 %   Neutro Abs 2.4 1.4 - 7.7 K/uL   Lymphs Abs 0.9 0.7 - 4.0 K/uL   Monocytes Absolute 0.4 0.1 - 1.0 K/uL   Eosinophils Absolute 0.1 0.0 - 0.7 K/uL   Basophils Absolute 0.0 0.0 - 0.1 K/uL  Hemoglobin A1c  Result  Value Ref Range   Hgb A1c MFr Bld 6.1 4.6 - 6.5 %  Protime-INR  Result Value Ref Range   INR 1.0 0.8 - 1.0 ratio   Prothrombin Time 11.5 9.6 - 13.1 sec  PSA, Medicare  Result Value Ref Range   PSA 0.19 0.10 - 4.00 ng/ml      Assessment & Plan:   Problem List Items Addressed This Visit    Prediabetes    Discussed A1c - pt will continue to  work towards healthy low carb/sugar diet.      Plantar fasciitis    Discussed etiology as well as treatment. discussed chronicity of condition. rec frozen water bottle massage, stretching exercises, and heel lifts.      NASH (nonalcoholic steatohepatitis)    Followed by [facility]. Appreciate their care. Reviewed labwork. Mild transaminitis with mild leukopenia and chronic thrombocytopenia.      Medicare annual wellness visit, subsequent - Primary    I have personally reviewed the Medicare Annual Wellness questionnaire and have noted 1. The patient's medical and social history 2. Their use of alcohol, tobacco or illicit drugs 3. Their current medications and supplements 4. The patient's functional ability including ADL's, fall risks, home safety risks and hearing or visual impairment. 5. Diet and physical activity 6. Evidence for depression or mood disorders The patients weight, height, BMI have been recorded in the chart.  Hearing and vision has been addressed. I have made referrals, counseling and provided education to the patient based review of the above and I have provided the pt with a written personalized care plan for preventive services. Provider list updated - see scanned questionairre. Reviewed preventative protocols and updated unless pt declined.       Hyperlipidemia    Check FLP in 3-4 mo once more regular with lipitor. Discussed PM administration.      Relevant Medications   atorvastatin (LIPITOR) 10 MG tablet   Cirrhosis    See above.      Barrett's esophagus    Followed regularly by GI. Last EGD 08/2014.       Arm pain, posterior    Not consistent with tennis elbow. Anticipate has developed mild chronic triceps tendinosis - discussed resting, aleve prn sparingly. If not improved, consider SM referral.      Advanced care planning/counseling discussion    Advanced directives - has at home. Wife is HCPOA. Asked to bring me a copy.          Follow up plan: Return in about 1 year (around 03/25/2016), or as needed, for annual exam, prior fasting for blood work.

## 2015-03-26 NOTE — Patient Instructions (Addendum)
zostavax today. Bring me a copy of your living will to update your chart. For heel - sounds like plantar fasciitis - try frozen water bottle massage, stretching, and heel lift. For R elbow - watch repetitive motions. May use ibuprofen or aleve sparingly for inflammation. Good to see you today, call us with quesitons. Return as needed or in 1 year for next wellness visit Return in 3-4 months for lab visit only to check cholesterol levels

## 2015-03-26 NOTE — Assessment & Plan Note (Addendum)
Discussed etiology as well as treatment. discussed chronicity of condition. rec frozen water bottle massage, stretching exercises, and heel lifts.

## 2015-03-26 NOTE — Assessment & Plan Note (Signed)
See above

## 2015-03-26 NOTE — Assessment & Plan Note (Signed)
Not consistent with tennis elbow. Anticipate has developed mild chronic triceps tendinosis - discussed resting, aleve prn sparingly. If not improved, consider SM referral.

## 2015-10-06 DIAGNOSIS — Z23 Encounter for immunization: Secondary | ICD-10-CM | POA: Diagnosis not present

## 2016-01-12 ENCOUNTER — Telehealth: Payer: Self-pay | Admitting: Family Medicine

## 2016-01-12 DIAGNOSIS — Z1159 Encounter for screening for other viral diseases: Secondary | ICD-10-CM

## 2016-01-12 NOTE — Telephone Encounter (Signed)
Pt made cpx appointment for may 1 and would like to do labs somewhere else He would like to pick up order for labs around 4/10 when he will be in town/

## 2016-01-19 NOTE — Telephone Encounter (Signed)
Rx written and in Kim's box. 

## 2016-01-19 NOTE — Telephone Encounter (Signed)
Patient notified orders are ready for pick up.

## 2016-03-08 DIAGNOSIS — E119 Type 2 diabetes mellitus without complications: Secondary | ICD-10-CM | POA: Diagnosis not present

## 2016-03-08 LAB — HM DIABETES EYE EXAM

## 2016-03-09 ENCOUNTER — Encounter: Payer: Self-pay | Admitting: Gastroenterology

## 2016-03-09 ENCOUNTER — Ambulatory Visit (INDEPENDENT_AMBULATORY_CARE_PROVIDER_SITE_OTHER): Payer: Medicare Other | Admitting: Gastroenterology

## 2016-03-09 ENCOUNTER — Other Ambulatory Visit (INDEPENDENT_AMBULATORY_CARE_PROVIDER_SITE_OTHER): Payer: Medicare Other

## 2016-03-09 VITALS — BP 132/80 | HR 60 | Ht 69.5 in | Wt 225.6 lb

## 2016-03-09 DIAGNOSIS — K7581 Nonalcoholic steatohepatitis (NASH): Secondary | ICD-10-CM

## 2016-03-09 DIAGNOSIS — K7469 Other cirrhosis of liver: Secondary | ICD-10-CM | POA: Diagnosis not present

## 2016-03-09 DIAGNOSIS — Z86010 Personal history of colonic polyps: Secondary | ICD-10-CM | POA: Diagnosis not present

## 2016-03-09 DIAGNOSIS — K7460 Unspecified cirrhosis of liver: Secondary | ICD-10-CM | POA: Diagnosis not present

## 2016-03-09 LAB — COMPREHENSIVE METABOLIC PANEL
ALK PHOS: 43 U/L (ref 39–117)
ALT: 87 U/L — AB (ref 0–53)
AST: 55 U/L — ABNORMAL HIGH (ref 0–37)
Albumin: 4.7 g/dL (ref 3.5–5.2)
BILIRUBIN TOTAL: 1 mg/dL (ref 0.2–1.2)
BUN: 19 mg/dL (ref 6–23)
CO2: 30 mEq/L (ref 19–32)
CREATININE: 1.12 mg/dL (ref 0.40–1.50)
Calcium: 10.2 mg/dL (ref 8.4–10.5)
Chloride: 101 mEq/L (ref 96–112)
GFR: 69.06 mL/min (ref >60.00)
GLUCOSE: 130 mg/dL — AB (ref 70–99)
Potassium: 4.7 mEq/L (ref 3.5–5.1)
SODIUM: 137 meq/L (ref 135–145)
TOTAL PROTEIN: 7.8 g/dL (ref 6.0–8.3)

## 2016-03-09 LAB — CBC WITH DIFFERENTIAL/PLATELET
BASOS ABS: 0 10*3/uL (ref 0.0–0.1)
Basophils Relative: 0.3 % (ref 0.0–3.0)
Eosinophils Absolute: 0.1 10*3/uL (ref 0.0–0.7)
Eosinophils Relative: 3.3 % (ref 0.0–5.0)
HCT: 43.4 % (ref 39.0–52.0)
Hemoglobin: 15 g/dL (ref 13.0–17.0)
LYMPHS ABS: 1.2 10*3/uL (ref 0.7–4.0)
Lymphocytes Relative: 29.4 % (ref 12.0–46.0)
MCHC: 34.6 g/dL (ref 30.0–36.0)
MCV: 86.7 fl (ref 78.0–100.0)
MONOS PCT: 9.4 % (ref 3.0–12.0)
Monocytes Absolute: 0.4 10*3/uL (ref 0.1–1.0)
NEUTROS PCT: 57.6 % (ref 43.0–77.0)
Neutro Abs: 2.4 10*3/uL (ref 1.4–7.7)
Platelets: 114 10*3/uL — ABNORMAL LOW (ref 150.0–400.0)
RBC: 5 Mil/uL (ref 4.22–5.81)
RDW: 14.4 % (ref 11.5–15.5)
WBC: 4.2 10*3/uL (ref 4.0–10.5)

## 2016-03-09 NOTE — Patient Instructions (Addendum)
You have been scheduled for a colonoscopy. Please follow written instructions given to you at your visit today.  Please pick up your prep supplies at the pharmacy within the next 1-3 days. If you use inhalers (even only as needed), please bring them with you on the day of your procedure. Your physician has requested that you go to www.startemmi.com and enter the access code given to you at your visit today. This web site gives a general overview about your procedure. However, you should still follow specific instructions given to you by our office regarding your preparation for the procedure.  You have been scheduled for an abdominal ultrasound at [facility] (1st floor of hospital) on 03-29-2016 at [place]. Please arrive 15 minutes prior to your appointment for registration. Make certain not to have anything to eat or drink 6 hours prior to your appointment. Should you need to reschedule your appointment, please contact radiology at [phone]. This test typically takes about 30 minutes to perform.  If you are age 60 or older, your body mass index should be between 23-30. Your Body mass index is 32.85 kg/(m^2). If this is out of the aforementioned range listed, please consider follow up with your Primary Care Provider.  If you are age 35 or younger, your body mass index should be between 19-25. Your Body mass index is 32.85 kg/(m^2). If this is out of the aformentioned range listed, please consider follow up with your Primary Care Provider.   Your physician has requested that you go to the basement for the following lab work before leaving today: cbc, cmp, afp  Thank you for choosing [facility]  Dr Wilfrid Lund III

## 2016-03-09 NOTE — Progress Notes (Signed)
[facility] Progress Note  Chief Complaint: cirrhosis, barrett's esophagus  Subjective History:  In Oct 2015, [place] wrote"History of Present Illness:  Jeffery Pruitt 70 year old with Jeffery Pruitt cirrhosis.  Adenomatous colon polyps were removed in 2013.  Barrett's esophagus was seen in 2013 as well.  In February, 2015 LFTs were normal.  Alpha-fetoprotein was 2.6.  In December, 2014 platelets were 123 and hemoglobin 14.3.  Ultrasound one year ago demonstrated hepatomegaly with hepatic steatosis.  Liver biopsy in 2009 demonstrated early cirrhotic parenchyma with prominent steatosis.  Patient's no GI complaints including abdominal pain or weight loss.  The only symptom of liver disease is thrombocytopenia.  He is received vaccinations for hepatitis A and B. and Pneumovax"  Oct 2015 EGD - irregular Z line Bx : no IM   Jeffery Pruitt is here to establish care with me today. He has felt well since his last visit. He travels a lot in his retirement, rarely drinks alcohol and tries to get some exercise. He denies change in bowel habits rectal bleeding dysphagia odynophagia or unexpected weight loss.  ROS: Cardiovascular:  no chest pain Respiratory: no dyspnea  The patient's Past Medical, Family and Social History were reviewed and are on file in the EMR.  Objective:  Med list reviewed  Vital signs in last 24 hrs: Filed Vitals:   03/09/16 0833  BP: 132/80  Pulse: 60    Physical Exam   HEENT: sclera anicteric, oral mucosa moist without lesions  Neck: supple, no thyromegaly, JVD or lymphadenopathy  Cardiac: RRR without murmurs, S1S2 heard, no peripheral edema  Pulm: clear to auscultation bilaterally, normal RR and effort noted  Abdomen: soft, No tenderness, with active bowel sounds. No guarding or palpable hepatosplenomegaly. Overweight  Skin; warm and dry, no jaundice or rash or spider nevi  Neuro, normal mentation no asterixis, normal gross motor function.  Recent Labs:  Lab Results  Component  Value Date   WBC 4.2 03/09/2016   HGB 15.0 03/09/2016   HCT 43.4 03/09/2016   MCV 86.7 03/09/2016   PLT 114.0* 03/09/2016  Platelet count 120,000 the year prior. CMP     Component Value Date/Time   NA 137 03/09/2016 0947   NA 140 02/22/2013   K 4.7 03/09/2016 0947   K 4.8 02/22/2013   CL 101 03/09/2016 0947   CO2 30 03/09/2016 0947   GLUCOSE 130* 03/09/2016 0947   BUN 19 03/09/2016 0947   CREATININE 1.12 03/09/2016 0947   CREATININE 1.18 02/22/2013   CALCIUM 10.2 03/09/2016 0947   PROT 7.8 03/09/2016 0947   PROT 7.1 02/22/2013   ALBUMIN 4.7 03/09/2016 0947   ALBUMIN 4.6 02/22/2013   AST 55* 03/09/2016 0947   AST 39 02/22/2013   ALT 87* 03/09/2016 0947   ALT 63 02/22/2013   ALKPHOS 43 03/09/2016 0947   ALKPHOS 48 02/22/2013   BILITOT 1.0 03/09/2016 0947   BILITOT 0.5 02/22/2013    INR 1.0  No AFP since Oct 2015 - = 3.3   @ASSESSMENTPLANBEGIN @ Assessment: Encounter Diagnoses  Name Primary?  Marland Kitchen NASH (nonalcoholic steatohepatitis) Yes  . History of colonic polyps   . Other cirrhosis of liver (Jeffery Pruitt)    He has a low M me LD score a year ago, but no updated lab since then. He is very good functional status. [place] apparently showed early stages of cirrhosis, however I'm somewhat bothered at the platelet count was falling.  He does not appear to have Barrett's esophagus based on  last endoscopy and biopsies. Plan: Surveillance colonoscopy CBC, CMP, INR, AFP Ultrasound to screen for hepatocellular carcinoma.  I'm not yet certain he will need [place] screening every 6 months given his apparent early stage of disease.  Over half of the 25 minute encounter was spent in counseling and coordination of care.   Topics discussed: Surveillance for hepatoma in a patient with cirrhosis, natural history of Jeffery Pruitt and cirrhosis, reviewed healthy diet and lifestyle.   Jeffery Pruitt

## 2016-03-10 ENCOUNTER — Encounter: Payer: Self-pay | Admitting: Family Medicine

## 2016-03-10 LAB — AFP TUMOR MARKER: AFP TUMOR MARKER: 3.9 ng/mL (ref <6.1)

## 2016-03-19 DIAGNOSIS — R7303 Prediabetes: Secondary | ICD-10-CM | POA: Diagnosis not present

## 2016-03-19 DIAGNOSIS — Z125 Encounter for screening for malignant neoplasm of prostate: Secondary | ICD-10-CM | POA: Diagnosis not present

## 2016-03-19 DIAGNOSIS — Z1159 Encounter for screening for other viral diseases: Secondary | ICD-10-CM | POA: Diagnosis not present

## 2016-03-19 DIAGNOSIS — R7309 Other abnormal glucose: Secondary | ICD-10-CM | POA: Diagnosis not present

## 2016-03-19 DIAGNOSIS — K7460 Unspecified cirrhosis of liver: Secondary | ICD-10-CM | POA: Diagnosis not present

## 2016-03-19 DIAGNOSIS — E785 Hyperlipidemia, unspecified: Secondary | ICD-10-CM | POA: Diagnosis not present

## 2016-03-19 DIAGNOSIS — K7581 Nonalcoholic steatohepatitis (NASH): Secondary | ICD-10-CM | POA: Diagnosis not present

## 2016-03-19 LAB — LIPID PANEL
CHOLESTEROL: 209
HDL: 38
LDL CALC: 129
Triglycerides: 210

## 2016-03-19 LAB — COMPREHENSIVE METABOLIC PANEL
ALBUMIN: 4.2
ALT: 130
AST: 67 U/L
Alkaline Phosphatase: 67 U/L
BILIRUBIN TOTAL: 0.9 mg/dL
BUN: 19 mg/dL (ref 4–21)
Creat: 1.1
Glucose: 138
Potassium: 4.1 mmol/L
Sodium: 138

## 2016-03-19 LAB — CBC
HEMOGLOBIN: 14.7 g/dL
PLATELET COUNT: 100
WBC: 3.4

## 2016-03-19 LAB — PROTIME-INR: INR: 1.1 (ref 0.9–1.1)

## 2016-03-19 LAB — PSA: Prostate Specific Ag, Serum: 0.2

## 2016-03-19 LAB — HEMOGLOBIN A1C: A1C: 5.9

## 2016-03-20 LAB — HEPATITIS C ANTIBODY: Hep C Virus Ab: <0.1

## 2016-03-29 ENCOUNTER — Ambulatory Visit (HOSPITAL_COMMUNITY)
Admission: RE | Admit: 2016-03-29 | Discharge: 2016-03-29 | Disposition: A | Discharge status: Home / Self Care | Payer: Medicare Other | Source: Ambulatory Visit | Attending: Gastroenterology | Admitting: Gastroenterology

## 2016-03-29 ENCOUNTER — Encounter: Payer: Self-pay | Admitting: Family Medicine

## 2016-03-29 ENCOUNTER — Ambulatory Visit (INDEPENDENT_AMBULATORY_CARE_PROVIDER_SITE_OTHER): Payer: Medicare Other | Admitting: Family Medicine

## 2016-03-29 VITALS — BP 122/76 | HR 60 | Temp 98.0°F | Ht 70.0 in | Wt 221.0 lb

## 2016-03-29 DIAGNOSIS — K760 Fatty (change of) liver, not elsewhere classified: Secondary | ICD-10-CM | POA: Diagnosis not present

## 2016-03-29 DIAGNOSIS — K7469 Other cirrhosis of liver: Secondary | ICD-10-CM | POA: Insufficient documentation

## 2016-03-29 DIAGNOSIS — Z23 Encounter for immunization: Secondary | ICD-10-CM | POA: Diagnosis not present

## 2016-03-29 DIAGNOSIS — Z86010 Personal history of colonic polyps: Secondary | ICD-10-CM | POA: Diagnosis not present

## 2016-03-29 DIAGNOSIS — Z7189 Other specified counseling: Secondary | ICD-10-CM

## 2016-03-29 DIAGNOSIS — Z0000 Encounter for general adult medical examination without abnormal findings: Secondary | ICD-10-CM | POA: Diagnosis not present

## 2016-03-29 DIAGNOSIS — K7581 Nonalcoholic steatohepatitis (NASH): Secondary | ICD-10-CM

## 2016-03-29 DIAGNOSIS — R161 Splenomegaly, not elsewhere classified: Secondary | ICD-10-CM | POA: Diagnosis not present

## 2016-03-29 DIAGNOSIS — K7460 Unspecified cirrhosis of liver: Secondary | ICD-10-CM

## 2016-03-29 DIAGNOSIS — E785 Hyperlipidemia, unspecified: Secondary | ICD-10-CM

## 2016-03-29 DIAGNOSIS — R7303 Prediabetes: Secondary | ICD-10-CM

## 2016-03-29 DIAGNOSIS — K22719 Barrett's esophagus with dysplasia, unspecified: Secondary | ICD-10-CM

## 2016-03-29 HISTORY — PX: COLONOSCOPY: SHX174

## 2016-03-29 NOTE — Progress Notes (Signed)
Pre visit review using our clinic review tool, if applicable. No additional management support is needed unless otherwise documented below in the visit note. 

## 2016-03-29 NOTE — Assessment & Plan Note (Signed)
Followed by GI. Continue daily PPI

## 2016-03-29 NOTE — Assessment & Plan Note (Addendum)
Sees Dr [name]. Pending colonoscopy. Had abd [place] this morning.  Mild transaminitis, leukopenia, thrombocytopenia.

## 2016-03-29 NOTE — Assessment & Plan Note (Signed)
Reviewed with patient

## 2016-03-29 NOTE — Assessment & Plan Note (Signed)
Advanced directives - has at home. Wife is HCPOA. Asked to bring me a copy.

## 2016-03-29 NOTE — Assessment & Plan Note (Signed)
Chronic. Continue lipitor 3m daily. Pt will work towards weight loss and diet changes to improve lipid levels. If remaining elevated, will increase lipitor next visit.

## 2016-03-29 NOTE — Progress Notes (Signed)
BP 122/76 mmHg  Pulse 60  Temp(Src) 98 F (36.7 C) (Oral)  Ht 5' 10"  (1.778 m)  Wt 221 lb (100.245 kg)  BMI 31.71 kg/m2   CC: medicare wellness visit  Subjective:    Patient ID: Jeffery Pruitt, male    DOB: 1946/03/07, 70 y.o.   MRN: [id number]  HPI: Jeffery Pruitt is a 70 y.o. male presenting on 03/29/2016 for Annual Exam   Good portion of year spent traveling - goes to [place].   Omeprazole occasionally causes diarrhea so he stops med for a few days. H/o barrett's and NASH followed by GI. Sees Dr [name] pending colonocsopy tomorrow.   Brings labwork done at [facility] in [place].  Mild leukopenia, thrombocytopenia, transaminitis and hyperlipidemia  Vision screen at eye doctor annually. Hearing screen passed [place] - none in past year Depression screen - passed  Preventative: Colonoscopy pending tomorrow Prostate screening - gets this checked yearly.  Flu shot yearly Pneumovax 2010, again today 2017, prevnar 08/2014 Td ~2009 zostavax - 2016 Advanced directives - has at home. Wife is HCPOA. Asked to bring me a copy. Seat belt use discussed Sunscreen use discussed. No changing moles.   Lives with wife Grown children Travels regularly to [place] in motor home. 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 Interim medical history since our last visit reviewed. Allergies and medications reviewed and updated. Current Outpatient Prescriptions on File Prior to Visit  Medication Sig  . atorvastatin (LIPITOR) 10 MG tablet Take 1 tablet (10 mg total) by mouth daily at 6 PM.  . fexofenadine (ALLEGRA) 180 MG tablet Take 180 mg by mouth daily as needed for allergies or rhinitis.  . Multiple Vitamin (MULTIVITAMIN) tablet Take 1  tablet by mouth daily.  Marland Kitchen omeprazole (PRILOSEC) 20 MG capsule Take 1 capsule (20 mg total) by mouth daily.   No current facility-administered medications on file prior to visit.    Review of Systems Per HPI unless specifically indicated in ROS section     Objective:    BP 122/76 mmHg  Pulse 60  Temp(Src) 98 F (36.7 C) (Oral)  Ht 5' 10"  (1.778 m)  Wt 221 lb (100.245 kg)  BMI 31.71 kg/m2  Wt Readings from Last 3 Encounters:  03/29/16 221 lb (100.245 kg)  03/09/16 225 lb 9.6 oz (102.331 kg)  03/26/15 220 lb (99.791 kg)    Physical Exam  Constitutional: He is oriented to person, place, and time. He appears well-developed and well-nourished. No distress.  HENT:  Head: Normocephalic and atraumatic.  Right Ear: Hearing, tympanic membrane, external ear and ear canal normal.  Left Ear: Hearing, tympanic membrane, external ear and ear canal normal.  Nose: Nose normal.  Mouth/Throat: Uvula is midline, oropharynx is clear and moist and mucous membranes are normal. No oropharyngeal exudate, posterior oropharyngeal edema or posterior oropharyngeal erythema.  Eyes: Conjunctivae and EOM are normal. Pupils are equal, round, and reactive to light. No scleral icterus.  Neck: Normal range of motion. Neck supple. Carotid bruit is not present. No thyromegaly present.  Cardiovascular: Normal rate, regular rhythm, normal heart sounds and intact distal pulses.   No murmur heard. Pulses:      Radial pulses are 2+ on the right side, and 2+ on the left side.  Pulmonary/Chest: Effort normal  and breath sounds normal. No respiratory distress. He has no wheezes. He has no rales.  Abdominal: Soft. Bowel sounds are normal. He exhibits no distension and no mass. There is no tenderness. There is no rebound and no guarding.  Genitourinary: Rectum normal and prostate normal. Rectal exam shows no external hemorrhoid, no internal hemorrhoid, no fissure, no mass, no tenderness and anal tone normal. Prostate is not  enlarged (20gm) and not tender.  Musculoskeletal: Normal range of motion. He exhibits no edema.  Lymphadenopathy:    He has no cervical adenopathy.  Neurological: He is alert and oriented to person, place, and time.  CN grossly intact, station and gait intact Recall 3/3 Calculation 4/5 serial 7s  Skin: Skin is warm and dry. No rash noted.  Psychiatric: He has a normal mood and affect. His behavior is normal. Judgment and thought content normal.  Nursing note and vitals reviewed.  Results for orders placed or performed in visit on 03/10/16  [facility] DIABETES EYE EXAM  Result Value Ref Range   [facility] Diabetic Eye Exam No Retinopathy No Retinopathy      Assessment & Plan:   Problem List Items Addressed This Visit    Liver cirrhosis secondary to NASH (nonalcoholic steatohepatitis)    Sees Dr [name]. Pending colonoscopy. Had abd [place] this morning.  Mild transaminitis, leukopenia, thrombocytopenia.      Barrett's esophagus    Followed by GI. Continue daily PPI      Prediabetes    Reviewed with patient.       Hyperlipidemia    Chronic. Continue lipitor 35m daily. Pt will work towards weight loss and diet changes to improve lipid levels. If remaining elevated, will increase lipitor next visit.      Medicare annual wellness visit, subsequent - Primary    I have personally reviewed the Medicare Annual Wellness questionnaire and have noted 1. The patient's medical and social history 2. Their use of alcohol, tobacco or illicit drugs 3. Their current medications and supplements 4. The patient's functional ability including ADL's, fall risks, home safety risks and hearing or visual impairment. Cognitive function has been assessed and addressed as indicated.  5. Diet and physical activity 6. Evidence for depression or mood disorders The patients weight, height, BMI have been recorded in the chart. I have made referrals, counseling and provided education to the patient based on review of the above  and I have provided the pt with a written personalized care plan for preventive services. Provider list updated.. See scanned questionairre as needed for further documentation. Reviewed preventative protocols and updated unless pt declined.       Advanced care planning/counseling discussion    Advanced directives - has at home. Wife is HCPOA. Asked to bring me a copy.          Follow up plan: Return in about 1 year (around 03/29/2017), or as needed, for follow up visit.  JRia Bush MD

## 2016-03-29 NOTE — Addendum Note (Signed)
Addended by: Royann Shivers A on: 03/29/2016 11:19 AM   Modules accepted: Orders

## 2016-03-29 NOTE — Assessment & Plan Note (Signed)

## 2016-03-29 NOTE — Patient Instructions (Addendum)
Pneumovax today. Bring me copy of your living will to update your chart.  Return in 1 yr sooner if needed  Health Maintenance, Male A healthy lifestyle and preventative care can promote health and wellness.  Maintain regular health, dental, and eye exams.  Eat a healthy diet. Foods like vegetables, fruits, whole grains, low-fat dairy products, and lean protein foods contain the nutrients you need and are low in calories. Decrease your intake of foods high in solid fats, added sugars, and salt. Get information about a proper diet from your health care provider, if necessary.  Regular physical exercise is one of the most important things you can do for your health. Most adults should get at least 150 minutes of moderate-intensity exercise (any activity that increases your heart rate and causes you to sweat) each week. In addition, most adults need muscle-strengthening exercises on 2 or more days a week.   Maintain a healthy weight. The body mass index (BMI) is a screening tool to identify possible weight problems. It provides an estimate of body fat based on height and weight. Your health care provider can find your BMI and can help you achieve or maintain a healthy weight. For males 20 years and older:  A BMI below 18.5 is considered underweight.  A BMI of 18.5 to 24.9 is normal.  A BMI of 25 to 29.9 is considered overweight.  A BMI of 30 and above is considered obese.  Maintain normal blood lipids and cholesterol by exercising and minimizing your intake of saturated fat. Eat a balanced diet with plenty of fruits and vegetables. Blood tests for lipids and cholesterol should begin at age 62 and be repeated every 5 years. If your lipid or cholesterol levels are high, you are over age 12, or you are at high risk for heart disease, you may need your cholesterol levels checked more frequently.Ongoing high lipid and cholesterol levels should be treated with medicines if diet and exercise are not  working.  If you smoke, find out from your health care provider how to quit. If you do not use tobacco, do not start.  Lung cancer screening is recommended for adults aged 74-80 years who are at high risk for developing lung cancer because of a history of smoking. A yearly low-dose CT scan of the lungs is recommended for people who have at least a 30-pack-year history of smoking and are current smokers or have quit within the past 15 years. A pack year of smoking is smoking an average of 1 pack of cigarettes a day for 1 year (for example, a 30-pack-year history of smoking could mean smoking 1 pack a day for 30 years or 2 packs a day for 15 years). Yearly screening should continue until the smoker has stopped smoking for at least 15 years. Yearly screening should be stopped for people who develop a health problem that would prevent them from having lung cancer treatment.  If you choose to drink alcohol, do not have more than 2 drinks per day. One drink is considered to be 12 oz (360 mL) of beer, 5 oz (150 mL) of wine, or 1.5 oz (45 mL) of liquor.  Avoid the use of street drugs. Do not share needles with anyone. Ask for help if you need support or instructions about stopping the use of drugs.  High blood pressure causes heart disease and increases the risk of stroke. High blood pressure is more likely to develop in:  People who have blood pressure in the  end of the normal range (100-139/85-89 mm Hg).  People who are overweight or obese.  People who are African American.  If you are 97-58 years of age, have your blood pressure checked every 3-5 years. If you are 54 years of age or older, have your blood pressure checked every year. You should have your blood pressure measured twice--once when you are at a hospital or clinic, and once when you are not at a hospital or clinic. Record the average of the two measurements. To check your blood pressure when you are not at a hospital or clinic, you can  use:  An automated blood pressure machine at a pharmacy.  A home blood pressure monitor.  If you are 21-50 years old, ask your health care provider if you should take aspirin to prevent heart disease.  Diabetes screening involves taking a blood sample to check your fasting blood sugar level. This should be done once every 3 years after age 63 if you are at a normal weight and without risk factors for diabetes. Testing should be considered at a younger age or be carried out more frequently if you are overweight and have at least 1 risk factor for diabetes.  Colorectal cancer can be detected and often prevented. Most routine colorectal cancer screening begins at the age of 61 and continues through age 37. However, your health care provider may recommend screening at an earlier age if you have risk factors for colon cancer. On a yearly basis, your health care provider may provide home test kits to check for hidden blood in the stool. A small camera at the end of a tube may be used to directly examine the colon (sigmoidoscopy or colonoscopy) to detect the earliest forms of colorectal cancer. Talk to your health care provider about this at age 14 when routine screening begins. A direct exam of the colon should be repeated every 5-10 years through age 85, unless early forms of precancerous polyps or small growths are found.  People who are at an increased risk for hepatitis B should be screened for this virus. You are considered at high risk for hepatitis B if:  You were born in a country where hepatitis B occurs often. Talk with your health care provider about which countries are considered high risk.  Your parents were born in a high-risk country and you have not received a shot to protect against hepatitis B (hepatitis B vaccine).  You have HIV or AIDS.  You use needles to inject street drugs.  You live with, or have sex with, someone who has hepatitis B.  You are a man who has sex with other  men (MSM).  You get hemodialysis treatment.  You take certain medicines for conditions like cancer, organ transplantation, and autoimmune conditions.  Hepatitis C blood testing is recommended for all people born from [birth year] through 1964 and any individual with known risk factors for hepatitis C.  Healthy men should no longer receive prostate-specific antigen (PSA) blood tests as part of routine cancer screening. Talk to your health care provider about prostate cancer screening.  Testicular cancer screening is not recommended for adolescents or adult males who have no symptoms. Screening includes self-exam, a health care provider exam, and other screening tests. Consult with your health care provider about any symptoms you have or any concerns you have about testicular cancer.  Practice safe sex. Use condoms and avoid high-risk sexual practices to reduce the spread of sexually transmitted infections (STIs).  You should  be screened for STIs, including gonorrhea and chlamydia if:  You are sexually active and are younger than 24 years.  You are older than 24 years, and your health care provider tells you that you are at risk for this type of infection.  Your sexual activity has changed since you were last screened, and you are at an increased risk for chlamydia or gonorrhea. Ask your health care provider if you are at risk.  If you are at risk of being infected with HIV, it is recommended that you take a prescription medicine daily to prevent HIV infection. This is called pre-exposure prophylaxis (PrEP). You are considered at risk if:  You are a man who has sex with other men (MSM).  You are a heterosexual man who is sexually active with multiple partners.  You take drugs by injection.  You are sexually active with a partner who has HIV.  Talk with your health care provider about whether you are at high risk of being infected with HIV. If you choose to begin PrEP, you should first be tested  for HIV. You should then be tested every 3 months for as long as you are taking PrEP.  Use sunscreen. Apply sunscreen liberally and repeatedly throughout the day. You should seek shade when your shadow is shorter than you. Protect yourself by wearing long sleeves, pants, a wide-brimmed hat, and sunglasses year round whenever you are outdoors.  Tell your health care provider of new moles or changes in moles, especially if there is a change in shape or color. Also, tell your health care provider if a mole is larger than the size of a pencil eraser.  A one-time screening for abdominal aortic aneurysm (AAA) and surgical repair of large AAAs by ultrasound is recommended for men aged 19-75 years who are current or former smokers.  Stay current with your vaccines (immunizations).   This information is not intended to replace advice given to you by your health care provider. Make sure you discuss any questions you have with your health care provider.   Document Released: 05/13/2008 Document Revised: 12/06/2014 Document Reviewed: 04/12/2011 Elsevier Interactive Patient Education [employer].

## 2016-03-30 ENCOUNTER — Encounter: Payer: Self-pay | Admitting: Gastroenterology

## 2016-03-30 ENCOUNTER — Telehealth: Payer: Self-pay

## 2016-03-30 ENCOUNTER — Ambulatory Visit (AMBULATORY_SURGERY_CENTER): Payer: Medicare Other | Admitting: Gastroenterology

## 2016-03-30 VITALS — BP 107/67 | HR 61 | Temp 98.2°F | Resp 13 | Ht 69.5 in | Wt 225.0 lb

## 2016-03-30 DIAGNOSIS — D123 Benign neoplasm of transverse colon: Secondary | ICD-10-CM | POA: Diagnosis not present

## 2016-03-30 DIAGNOSIS — Z86010 Personal history of colonic polyps: Secondary | ICD-10-CM | POA: Diagnosis not present

## 2016-03-30 DIAGNOSIS — K635 Polyp of colon: Secondary | ICD-10-CM | POA: Diagnosis not present

## 2016-03-30 DIAGNOSIS — D122 Benign neoplasm of ascending colon: Secondary | ICD-10-CM | POA: Diagnosis not present

## 2016-03-30 MED ORDER — SODIUM CHLORIDE 0.9 % IV SOLN: 500.0000 mL | INTRAVENOUS | Status: DC

## 2016-03-30 NOTE — Progress Notes (Signed)
Called to room to assist during endoscopic procedure.  Patient ID and intended procedure confirmed with present staff. Received instructions for my participation in the procedure from the performing physician.  

## 2016-03-30 NOTE — Op Note (Signed)
[name] Procedure Date: 03/30/2016 1:16 PM MRN: [id number] Endoscopist: Mallie Mussel L. [name] , MD Age: 70 Date of Birth: 10-03-1946 Gender: Male Procedure:                Colonoscopy Indications:              Surveillance: Personal history of adenomatous                            polyps on last colonoscopy > 5 years ago Medicines:                Monitored Anesthesia Care Procedure:                Pre-Anesthesia Assessment:                           - Prior to the procedure, a History and Physical                            was performed, and patient medications and                            allergies were reviewed. The patient's tolerance of                            previous anesthesia was also reviewed. The risks                            and benefits of the procedure and the sedation                            options and risks were discussed with the patient.                            All questions were answered, and informed consent                            was obtained. Prior Anticoagulants: The patient has                            taken no previous anticoagulant or antiplatelet                            agents. ASA Grade Assessment: III - A patient with                            severe systemic disease. After reviewing the risks                            and benefits, the patient was deemed in                            satisfactory condition to undergo the procedure.  After obtaining informed consent, the colonoscope                            was passed under direct vision. Throughout the                            procedure, the patient's blood pressure, pulse, and                            oxygen saturations were monitored continuously. The                            Model CF-HQ190L [id number]) scope was introduced                            through the anus and advanced to the the cecum,              identified by appendiceal orifice and ileocecal                            valve. The colonoscopy was performed without                            difficulty. The patient tolerated the procedure                            well. The quality of the bowel preparation was                            good. The ileocecal valve, appendiceal orifice, and                            rectum were photographed. Scope In: 1:36:44 PM Scope Out: 1:50:54 PM Scope Withdrawal Time: 0 hours 11 minutes 34 seconds  Total Procedure Duration: 0 hours 14 minutes 10 seconds  Findings:                 The perianal and digital rectal examinations were                            normal.                           Two sessile polyps were found in the hepatic                            flexure. The polyps were 5 mm in size. These polyps                            were removed with a cold snare. Resection and                            retrieval were complete.  Internal hemorrhoids were found during                            retroflexion. The hemorrhoids were small and Grade                            I (internal hemorrhoids that do not prolapse).                           Multiple medium-mouthed diverticula were found in                            the left colon.                           The exam was otherwise without abnormality. Complications:            No immediate complications. Estimated Blood Loss:     Estimated blood loss: none. Impression:               - Two 5 mm polyps at the hepatic flexure, removed                            with a cold snare. Resected and retrieved.                           - Internal hemorrhoids.                           - Diverticulosis in the left colon.                           - The examination was otherwise normal. Recommendation:           - Patient has a contact number available for                            emergencies. The signs and  symptoms of potential                            delayed complications were discussed with the                            patient. Return to normal activities tomorrow.                            Written discharge instructions were provided to the                            patient.                           - Resume previous diet.                           - Continue present medications.                           -  No aspirin, ibuprofen, naproxen, or other                            non-steroidal anti-inflammatory drugs for 5 days                            after polyp removal.                           - Await pathology results.                           - Repeat colonoscopy is recommended for                            surveillance. The colonoscopy date will be                            determined after pathology results from today's                            exam become available for review. Bertice Risse L. [name], MD 03/30/2016 1:57:22 PM This report has been signed electronically.

## 2016-03-30 NOTE — Patient Instructions (Signed)
YOU HAD AN ENDOSCOPIC PROCEDURE TODAY AT [facility]:   Refer to the procedure report that was given to you for any specific questions about what was found during the examination.  If the procedure report does not answer your questions, please call your gastroenterologist to clarify.  If you requested that your care partner not be given the details of your procedure findings, then the procedure report has been included in a sealed envelope for you to review at your convenience later.  YOU SHOULD EXPECT: Some feelings of bloating in the abdomen. Passage of more gas than usual.  Walking can help get rid of the air that was put into your GI tract during the procedure and reduce the bloating. If you had a lower endoscopy (such as a colonoscopy or flexible sigmoidoscopy) you may notice spotting of blood in your stool or on the toilet paper. If you underwent a bowel prep for your procedure, you may not have a normal bowel movement for a few days.  Please Note:  You might notice some irritation and congestion in your nose or some drainage.  This is from the oxygen used during your procedure.  There is no need for concern and it should clear up in a day or so.  SYMPTOMS TO REPORT IMMEDIATELY:   Following lower endoscopy (colonoscopy or flexible sigmoidoscopy):  Excessive amounts of blood in the stool  Significant tenderness or worsening of abdominal pains  Swelling of the abdomen that is new, acute  Fever of 100F or higher    For urgent or emergent issues, a gastroenterologist can be reached at any hour by calling [phone].   DIET: Your first meal following the procedure should be a small meal and then it is ok to progress to your normal diet. Heavy or fried foods are harder to digest and may make you feel nauseous or bloated.  Likewise, meals heavy in dairy and vegetables can increase bloating.  Drink plenty of fluids but you should avoid alcoholic beverages for 24  hours.  ACTIVITY:  You should plan to take it easy for the rest of today and you should NOT DRIVE or use heavy machinery until tomorrow (because of the sedation medicines used during the test).    FOLLOW UP: Our staff will call the number listed on your records the next business day following your procedure to check on you and address any questions or concerns that you may have regarding the information given to you following your procedure. If we do not reach you, we will leave a message.  However, if you are feeling well and you are not experiencing any problems, there is no need to return our call.  We will assume that you have returned to your regular daily activities without incident.  If any biopsies were taken you will be contacted by phone or by letter within the next 1-3 weeks.  Please call us at [phone] if you have not heard about the biopsies in 3 weeks.    SIGNATURES/CONFIDENTIALITY: You and/or your care partner have signed paperwork which will be entered into your electronic medical record.  These signatures attest to the fact that that the information above on your After Visit Summary has been reviewed and is understood.  Full responsibility of the confidentiality of this discharge information lies with you and/or your care-partner.   No aspirin,ibuprofen,naproxen,or NSAIDS for 5 days,but resume remainder of medications. Information given on polyps,diverticulosis,hemorrhoids and high fiber diet.

## 2016-03-30 NOTE — Telephone Encounter (Signed)
Faxed request from [email or web address] on Omeprazole needed to refill. Do you want him to contnuie

## 2016-03-30 NOTE — Progress Notes (Signed)
A and Ox 3 Report to RN

## 2016-03-31 ENCOUNTER — Telehealth: Payer: Self-pay | Admitting: *Deleted

## 2016-03-31 NOTE — Telephone Encounter (Signed)
No answer, message left for the patient. 

## 2016-04-02 ENCOUNTER — Encounter: Payer: Self-pay | Admitting: Gastroenterology

## 2016-04-02 NOTE — Telephone Encounter (Signed)
No , I do not feel he needs this medication any longer

## 2016-04-02 NOTE — Telephone Encounter (Signed)
Noted. Refills denied.

## 2016-04-06 ENCOUNTER — Other Ambulatory Visit: Payer: Self-pay | Admitting: *Deleted

## 2016-04-06 MED ORDER — ATORVASTATIN CALCIUM 10 MG PO TABS: 10.0000 mg | ORAL_TABLET | Freq: Every day | ORAL | Status: DC

## 2016-04-07 ENCOUNTER — Encounter: Payer: Self-pay | Admitting: Family Medicine

## 2016-04-08 ENCOUNTER — Encounter: Payer: Self-pay | Admitting: *Deleted

## 2016-04-14 ENCOUNTER — Encounter: Payer: Self-pay | Admitting: Family Medicine

## 2016-04-17 ENCOUNTER — Encounter: Payer: Self-pay | Admitting: Gastroenterology

## 2016-04-19 NOTE — Telephone Encounter (Signed)
See phone note from 03-30-2016. Dr [name] didn't see a need for him to continue.

## 2016-04-19 NOTE — Telephone Encounter (Signed)
Vivien Rota do you know anything about this, I cant find any note in the system?

## 2016-04-20 ENCOUNTER — Other Ambulatory Visit: Payer: Self-pay | Admitting: *Deleted

## 2016-08-29 DIAGNOSIS — Z23 Encounter for immunization: Secondary | ICD-10-CM | POA: Diagnosis not present

## 2017-01-20 ENCOUNTER — Other Ambulatory Visit: Payer: Self-pay | Admitting: Family Medicine

## 2017-03-08 DIAGNOSIS — S83281A Other tear of lateral meniscus, current injury, right knee, initial encounter: Secondary | ICD-10-CM | POA: Diagnosis not present

## 2017-03-08 DIAGNOSIS — M25561 Pain in right knee: Secondary | ICD-10-CM | POA: Diagnosis not present

## 2017-03-08 DIAGNOSIS — M1711 Unilateral primary osteoarthritis, right knee: Secondary | ICD-10-CM | POA: Diagnosis not present

## 2017-03-30 DIAGNOSIS — R739 Hyperglycemia, unspecified: Secondary | ICD-10-CM | POA: Diagnosis not present

## 2017-03-30 DIAGNOSIS — Z125 Encounter for screening for malignant neoplasm of prostate: Secondary | ICD-10-CM | POA: Diagnosis not present

## 2017-03-30 DIAGNOSIS — R030 Elevated blood-pressure reading, without diagnosis of hypertension: Secondary | ICD-10-CM | POA: Diagnosis not present

## 2017-03-30 DIAGNOSIS — R1030 Lower abdominal pain, unspecified: Secondary | ICD-10-CM | POA: Diagnosis not present

## 2017-03-30 DIAGNOSIS — Z1389 Encounter for screening for other disorder: Secondary | ICD-10-CM | POA: Diagnosis not present

## 2017-03-30 DIAGNOSIS — Z0000 Encounter for general adult medical examination without abnormal findings: Secondary | ICD-10-CM | POA: Diagnosis not present

## 2017-03-30 DIAGNOSIS — E785 Hyperlipidemia, unspecified: Secondary | ICD-10-CM | POA: Diagnosis not present

## 2017-03-30 DIAGNOSIS — Z79899 Other long term (current) drug therapy: Secondary | ICD-10-CM | POA: Diagnosis not present

## 2017-03-30 DIAGNOSIS — Z6831 Body mass index (BMI) 31.0-31.9, adult: Secondary | ICD-10-CM | POA: Diagnosis not present

## 2017-04-05 DIAGNOSIS — M25661 Stiffness of right knee, not elsewhere classified: Secondary | ICD-10-CM | POA: Diagnosis not present

## 2017-04-05 DIAGNOSIS — M25561 Pain in right knee: Secondary | ICD-10-CM | POA: Diagnosis not present

## 2017-04-05 DIAGNOSIS — M1711 Unilateral primary osteoarthritis, right knee: Secondary | ICD-10-CM | POA: Diagnosis not present

## 2017-05-05 DIAGNOSIS — S0101XA Laceration without foreign body of scalp, initial encounter: Secondary | ICD-10-CM | POA: Diagnosis not present

## 2017-05-05 DIAGNOSIS — W228XXA Striking against or struck by other objects, initial encounter: Secondary | ICD-10-CM | POA: Diagnosis not present

## 2017-05-12 DIAGNOSIS — Z4802 Encounter for removal of sutures: Secondary | ICD-10-CM | POA: Diagnosis not present

## 2017-06-28 ENCOUNTER — Telehealth: Payer: Self-pay | Admitting: Gastroenterology

## 2017-06-28 ENCOUNTER — Other Ambulatory Visit: Payer: Self-pay

## 2017-06-28 DIAGNOSIS — K7460 Unspecified cirrhosis of liver: Secondary | ICD-10-CM

## 2017-06-28 DIAGNOSIS — K7581 Nonalcoholic steatohepatitis (NASH): Principal | ICD-10-CM

## 2017-06-28 NOTE — Telephone Encounter (Signed)
Do you want to have patient get labs before his office visit, and if so which ones? Thank you.

## 2017-06-28 NOTE — Telephone Encounter (Signed)
Yes, please. CBC, CMP, INR, Alpha fetoprotein

## 2017-06-28 NOTE — Telephone Encounter (Signed)
Faxed lab requisition to [facility] outpatient lab as patient requested. Left vm for patient that I will fax to the number he left.

## 2017-06-28 NOTE — Telephone Encounter (Signed)
Order placed in Epic for lab work, called and had to lvm for patient to please get labs done 1-2 days prior to his appointment. If he has further questions to please call our office.

## 2017-06-29 DIAGNOSIS — K7469 Other cirrhosis of liver: Secondary | ICD-10-CM | POA: Diagnosis not present

## 2017-06-29 DIAGNOSIS — K7581 Nonalcoholic steatohepatitis (NASH): Secondary | ICD-10-CM | POA: Diagnosis not present

## 2017-07-01 NOTE — Telephone Encounter (Signed)
Labs received on 06-30-2017. Placed on dr [name]' desk for review.

## 2017-07-05 ENCOUNTER — Telehealth: Payer: Self-pay | Admitting: Family Medicine

## 2017-07-05 NOTE — Telephone Encounter (Signed)
Left pt message asking to call Ebony Hail back directly at [phone] to schedule AWV + labs with Katha Cabal and CPE with PCP.  *NOTE* Last AWV 03/29/16

## 2017-07-08 ENCOUNTER — Ambulatory Visit (INDEPENDENT_AMBULATORY_CARE_PROVIDER_SITE_OTHER): Payer: Medicare Other | Admitting: Gastroenterology

## 2017-07-08 ENCOUNTER — Encounter: Payer: Self-pay | Admitting: Gastroenterology

## 2017-07-08 VITALS — BP 144/78 | HR 70 | Ht 69.5 in | Wt 229.0 lb

## 2017-07-08 DIAGNOSIS — K7460 Unspecified cirrhosis of liver: Secondary | ICD-10-CM | POA: Diagnosis not present

## 2017-07-08 DIAGNOSIS — K760 Fatty (change of) liver, not elsewhere classified: Secondary | ICD-10-CM | POA: Diagnosis not present

## 2017-07-08 NOTE — Patient Instructions (Signed)
You have been scheduled for an endoscopy. Please follow written instructions given to you at your visit today. If you use inhalers (even only as needed), please bring them with you on the day of your procedure. Your physician has requested that you go to www.startemmi.com and enter the access code given to you at your visit today. This web site gives a general overview about your procedure. However, you should still follow specific instructions given to you by our office regarding your preparation for the procedure.    You have been scheduled for an abdominal ultrasound at [facility] (1st floor of hospital) on 07-25-17 at 9:30am. Please arrive 15 minutes prior to your appointment for registration. Make certain not to have anything to eat or drink 6 hours prior to your appointment. Should you need to reschedule your appointment, please contact radiology at [phone]. This test typically takes about 30 minutes to perform.  If you are age 102 or older, your body mass index should be between 23-30. Your Body mass index is 33.33 kg/m. If this is out of the aforementioned range listed, please consider follow up with your Primary Care Provider.  If you are age 69 or younger, your body mass index should be between 19-25. Your Body mass index is 33.33 kg/m. If this is out of the aformentioned range listed, please consider follow up with your Primary Care Provider.   Thank you for choosing [facility]  Dr Wilfrid Lund III

## 2017-07-08 NOTE — Progress Notes (Signed)
     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 Standage sees me for his annual checkup for cirrhosis. He has had this condition at least since a liver biopsy in 2009, and he is always been compensated with no complications of cirrhosis. He stays active in his retirement, traveling with his wife at least half the year. It has been their summers in [place] [facility], about 2[place]5 hrs from here.  He is feeling well, with no abdominal pain, chest pain, dyspnea or other physical complaints. Viaan got a primary care provider in the [place] area who did some labs on him recently, and the results were faxed to me and noted below.  The patient's Past Medical, Family and Social History were reviewed and are on file in the EMR.  Objective:  Med list reviewed  Vital signs in last 24 hrs: Vitals:   07/08/17 1136  BP: (!) 144/78  Pulse: 70    Physical Exam    HEENT: sclera anicteric, oral mucosa moist without lesions  Neck: supple, no thyromegaly, JVD or lymphadenopathy  Cardiac: RRR without murmurs, S1S2 heard, no peripheral edema  Pulm: clear to auscultation bilaterally, normal RR and effort noted  Abdomen: soft, No tenderness, with active bowel sounds. No guarding or palpable hepatosplenomegaly. No bulging flanks or shifting dullness.  + Rectus diastasis  Skin; warm and dry, no jaundice or rash  Recent Labs:  Colon 5/17 TA x 2   06/29/2017:    WBC 3.4 hemoglobin 14.5 platelets 94 BMP normal AST 64 ALT 120 alkaline phosphatase 55 albumin 4.0 bilirubin 0.9 INR 1.1 AFP normal at 4.3  Radiologic studies:  [place] 5/17 - no liver mass,  Fatty liver  @ASSESSMENTPLANBEGIN @ Assessment: Encounter Diagnoses  Name Primary?  . Fatty liver Yes  . Cirrhosis of liver without ascites, unspecified hepatic cirrhosis type ([place])    His platelet count is down from 120 last year, but I see that it was 100 a couple years prior to that. So his labs are  stable overall. He still appears to have compensated disease with no complications. He is in need of hepatoma screening and variceal screening.   Plan: EGD and right upper quadrant ultrasound. We are trying to do them both the same day since he has to travel so far. He is agreeable to both  The benefits and risks of the planned procedure were described in detail with the patient or (when appropriate) their health care proxy.  Risks were outlined as including, but not limited to, bleeding, infection, perforation, adverse medication reaction leading to cardiac or pulmonary decompensation, or pancreatitis (if ERCP).  The limitation of incomplete mucosal visualization was also discussed.  No guarantees or warranties were given.   Total time 25 minutes, over half spent in counseling and coordination of care.   Nelida Meuse III

## 2017-07-25 ENCOUNTER — Encounter: Payer: Self-pay | Admitting: Gastroenterology

## 2017-07-25 ENCOUNTER — Ambulatory Visit (AMBULATORY_SURGERY_CENTER): Payer: Medicare Other | Admitting: Gastroenterology

## 2017-07-25 ENCOUNTER — Ambulatory Visit (HOSPITAL_COMMUNITY)
Admission: RE | Admit: 2017-07-25 | Discharge: 2017-07-25 | Disposition: A | Discharge status: Home / Self Care | Payer: Medicare Other | Source: Ambulatory Visit | Attending: Gastroenterology | Admitting: Gastroenterology

## 2017-07-25 ENCOUNTER — Ambulatory Visit (HOSPITAL_COMMUNITY): Payer: Medicare Other

## 2017-07-25 VITALS — BP 118/70 | HR 63 | Temp 98.6°F | Resp 10 | Ht 69.0 in | Wt 229.0 lb

## 2017-07-25 DIAGNOSIS — Z9049 Acquired absence of other specified parts of digestive tract: Secondary | ICD-10-CM | POA: Diagnosis not present

## 2017-07-25 DIAGNOSIS — K7460 Unspecified cirrhosis of liver: Secondary | ICD-10-CM

## 2017-07-25 DIAGNOSIS — E669 Obesity, unspecified: Secondary | ICD-10-CM | POA: Diagnosis not present

## 2017-07-25 DIAGNOSIS — K7581 Nonalcoholic steatohepatitis (NASH): Secondary | ICD-10-CM

## 2017-07-25 DIAGNOSIS — K760 Fatty (change of) liver, not elsewhere classified: Secondary | ICD-10-CM | POA: Insufficient documentation

## 2017-07-25 MED ORDER — SODIUM CHLORIDE 0.9 % IV SOLN: 500.0000 mL | INTRAVENOUS | Status: DC

## 2017-07-25 NOTE — Progress Notes (Signed)
Pt's states no medical or surgical changes since previsit or office visit. 

## 2017-07-25 NOTE — Progress Notes (Signed)
No problems noted in the recovery room. maw 

## 2017-07-25 NOTE — Progress Notes (Signed)
To recovery, report to RN, VSS. 

## 2017-07-25 NOTE — Op Note (Signed)
Jeffery Pruitt: Jeffery Pruitt Procedure Date: 07/25/2017 3:28 PM MRN: [id number] Endoscopist: [place]. [name] , MD Age: 71 Referring MD:  Date of Birth: Jul 20, 1946 Gender: Male Account #: [id number] Procedure:                Upper GI endoscopy Indications:              Compensated, stable cirrhosis - screen for                            esophageal varices Medicines:                Monitored Anesthesia Care Procedure:                Pre-Anesthesia Assessment:                           - Prior to the procedure, a History and Physical                            was performed, and patient medications and                            allergies were reviewed. The patient's tolerance of                            previous anesthesia was also reviewed. The risks                            and benefits of the procedure and the sedation                            options and risks were discussed with the patient.                            All questions were answered, and informed consent                            was obtained. Prior Anticoagulants: The patient has                            taken no previous anticoagulant or antiplatelet                            agents. ASA Grade Assessment: III - A patient with                            severe systemic disease. After reviewing the risks                            and benefits, the patient was deemed in                            satisfactory condition to undergo the procedure.  After obtaining informed consent, the endoscope was                            passed under direct vision. Throughout the                            procedure, the patient's blood pressure, pulse, and                            oxygen saturations were monitored continuously. The                            Model GIF-[id number] scope was introduced                            through the mouth, and advanced to the second  part                            of duodenum. The upper GI endoscopy was                            accomplished without difficulty. The patient                            tolerated the procedure well. Scope In: Scope Out: Findings:                 The esophagus was normal. No varices were seen.                           The stomach was normal.                           The cardia and gastric fundus were normal on                            retroflexion. No varices were seen.                           The examined duodenum was normal. Complications:            No immediate complications. Estimated Blood Loss:     Estimated blood loss: none. Impression:               - Normal esophagus.                           - Normal stomach.                           - Normal examined duodenum.                           - No specimens collected. Recommendation:           - Patient has a contact number available for  emergencies. The signs and symptoms of potential                            delayed complications were discussed with the                            patient. Return to normal activities tomorrow.                            Written discharge instructions were provided to the                            patient.                           - Resume previous diet.                           - Continue present medications.                           - Repeat upper endoscopy in 3 years for screening                            purposes.                           - Return to GI clinic in 9 months, after returning                            from traveling. Tarini Carrier L. [name], MD 07/25/2017 3:46:42 PM This report has been signed electronically.

## 2017-07-25 NOTE — Patient Instructions (Signed)
YOU HAD AN ENDOSCOPIC PROCEDURE TODAY AT [facility]:   Refer to the procedure report that was given to you for any specific questions about what was found during the examination.  If the procedure report does not answer your questions, please call your gastroenterologist to clarify.  If you requested that your care partner not be given the details of your procedure findings, then the procedure report has been included in a sealed envelope for you to review at your convenience later.  YOU SHOULD EXPECT: Some feelings of bloating in the abdomen. Passage of more gas than usual.  Walking can help get rid of the air that was put into your GI tract during the procedure and reduce the bloating. If you had a lower endoscopy (such as a colonoscopy or flexible sigmoidoscopy) you may notice spotting of blood in your stool or on the toilet paper. If you underwent a bowel prep for your procedure, you may not have a normal bowel movement for a few days.  Please Note:  You might notice some irritation and congestion in your nose or some drainage.  This is from the oxygen used during your procedure.  There is no need for concern and it should clear up in a day or so.  SYMPTOMS TO REPORT IMMEDIATELY:    Following upper endoscopy (EGD)  Vomiting of blood or coffee ground material  New chest pain or pain under the shoulder blades  Painful or persistently difficult swallowing  New shortness of breath  Fever of 100F or higher  Black, tarry-looking stools  For urgent or emergent issues, a gastroenterologist can be reached at any hour by calling [phone].   DIET:  We do recommend a small meal at first, but then you may proceed to your regular diet.  Drink plenty of fluids but you should avoid alcoholic beverages for 24 hours.  ACTIVITY:  You should plan to take it easy for the rest of today and you should NOT DRIVE or use heavy machinery until tomorrow (because of the sedation medicines used  during the test).    FOLLOW UP: Our staff will call the number listed on your records the next business day following your procedure to check on you and address any questions or concerns that you may have regarding the information given to you following your procedure. If we do not reach you, we will leave a message.  However, if you are feeling well and you are not experiencing any problems, there is no need to return our call.  We will assume that you have returned to your regular daily activities without incident.  If any biopsies were taken you will be contacted by phone or by letter within the next 1-3 weeks.  Please call us at [phone] if you have not heard about the biopsies in 3 weeks.    SIGNATURES/CONFIDENTIALITY: You and/or your care partner have signed paperwork which will be entered into your electronic medical record.  These signatures attest to the fact that that the information above on your After Visit Summary has been reviewed and is understood.  Full responsibility of the confidentiality of this discharge information lies with you and/or your care-partner.    Return to GI clinic in 9 months, after returning from traveling. Repeat upper endoscopy in 3 years for screening purposes. You may resume your current medications today. Please call if any questions or concerns.

## 2017-07-26 ENCOUNTER — Telehealth: Payer: Self-pay

## 2017-07-26 NOTE — Telephone Encounter (Signed)
  Follow up Call-  Call back number 07/25/2017 03/30/2016  Post procedure Call Back phone  # [phone]  Permission to leave phone message Yes Yes  Some recent data might be hidden     Left message

## 2017-07-26 NOTE — Telephone Encounter (Signed)
  Follow up Call-  Call back number 07/25/2017 03/30/2016  Post procedure Call Back phone  # [phone]  Permission to leave phone message Yes Yes  Some recent data might be hidden     Patient questions:  Do you have a fever, pain , or abdominal swelling? No. Pain Score  0 *  Have you tolerated food without any problems? Yes.    Have you been able to return to your normal activities? Yes.    Do you have any questions about your discharge instructions: Diet   No. Medications  No. Follow up visit  No.  Do you have questions or concerns about your Care? No.  Actions: * If pain score is 4 or above: No action needed, pain <4.

## 2017-08-10 NOTE — Telephone Encounter (Signed)
No longer our pt

## 2017-08-30 DIAGNOSIS — M5432 Sciatica, left side: Secondary | ICD-10-CM | POA: Diagnosis not present

## 2017-08-30 DIAGNOSIS — M545 Low back pain: Secondary | ICD-10-CM | POA: Diagnosis not present

## 2017-08-30 DIAGNOSIS — Z23 Encounter for immunization: Secondary | ICD-10-CM | POA: Diagnosis not present

## 2017-09-12 DIAGNOSIS — M545 Low back pain: Secondary | ICD-10-CM | POA: Diagnosis not present

## 2017-09-12 DIAGNOSIS — M5432 Sciatica, left side: Secondary | ICD-10-CM | POA: Diagnosis not present

## 2017-09-16 IMAGING — US US ABDOMEN LIMITED
1 series · 14 of 25 positions shown · non-contrast
Comparison: 02/23/2013

CLINICAL DATA: Cirrhosis

EXAM:
ULTRASOUND ABDOMEN LIMITED RIGHT UPPER QUADRANT

[Series 1: us abdomen limited · 0.26mm/px · 14 of 56 slices shown]
[im 1/56]
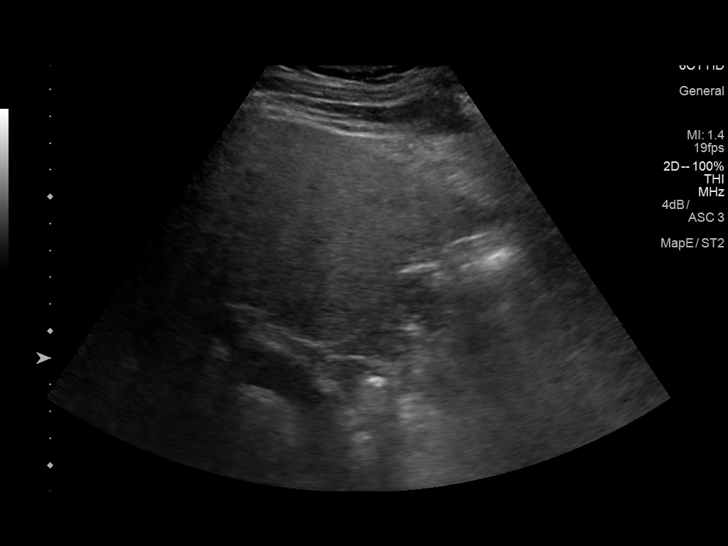
[im 5/56]
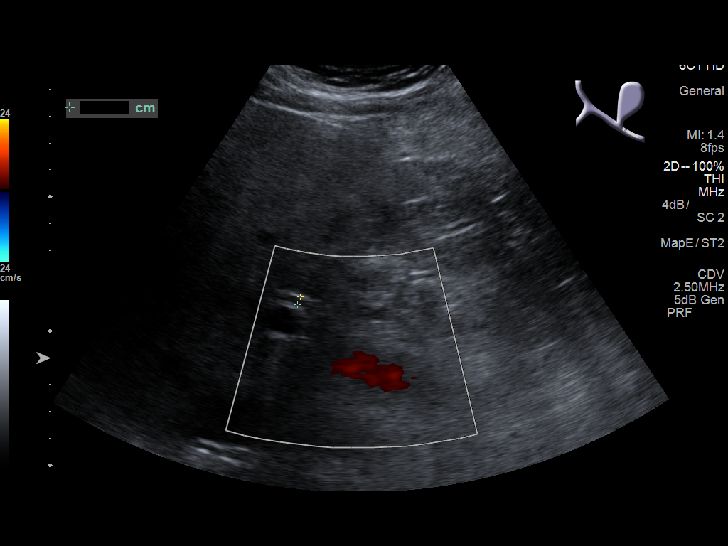
[im 10/56]
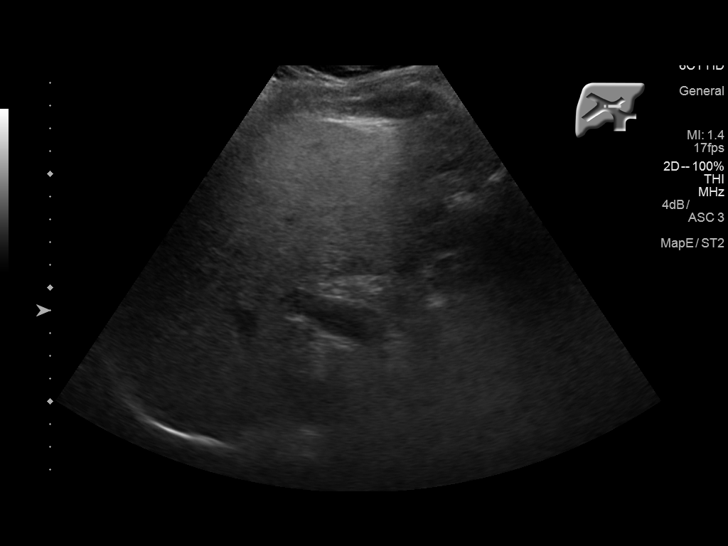
[im 14/56]
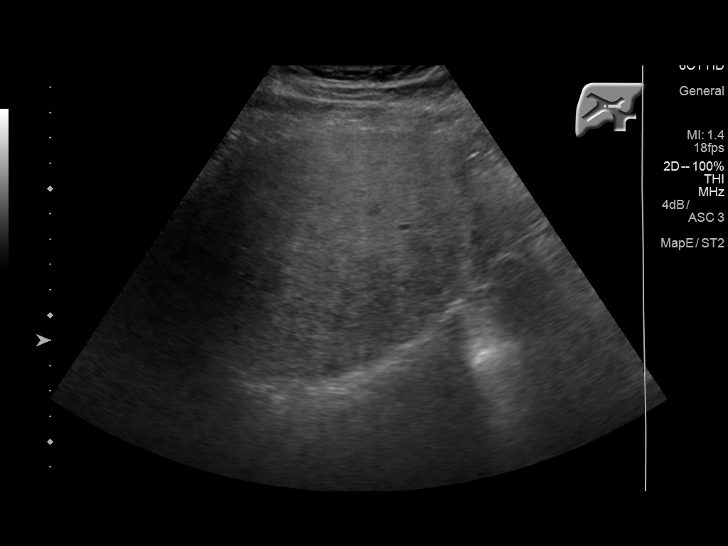
[im 19/56]
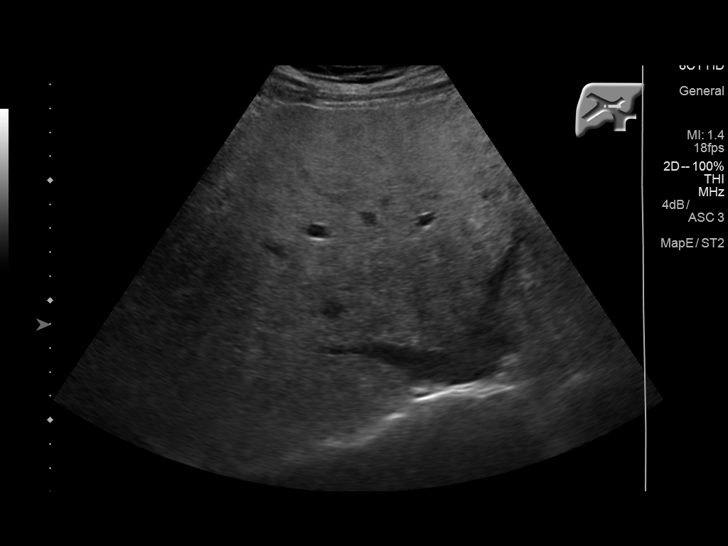
[im 21/56]
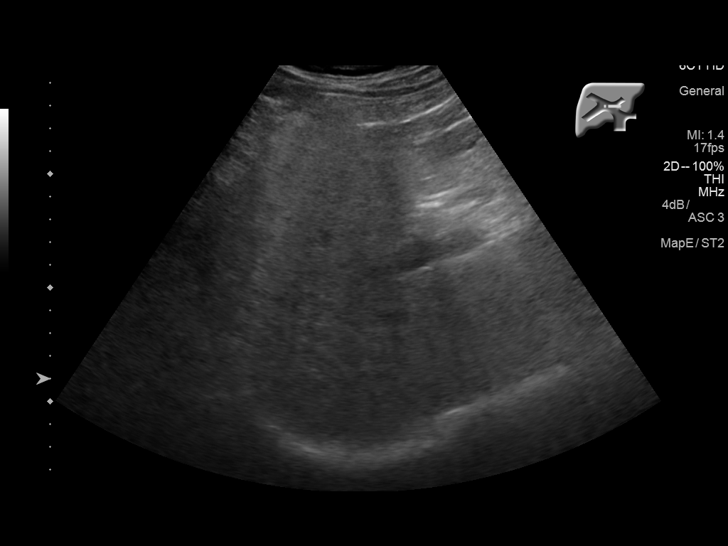
[im 26/56]
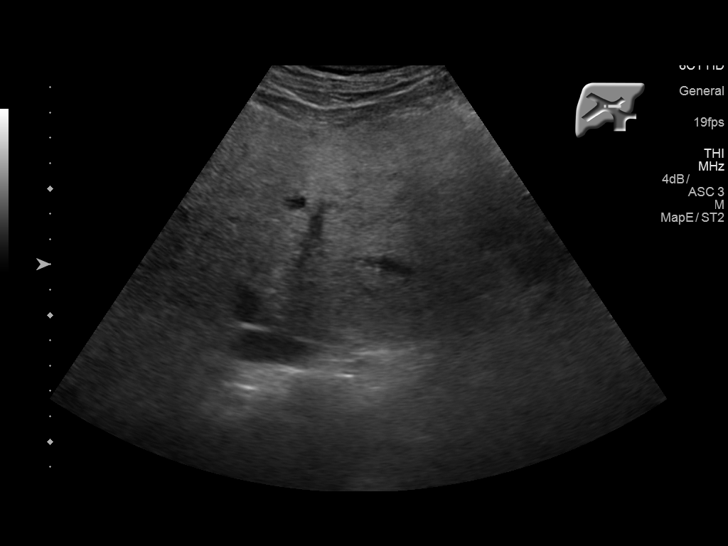
[im 30/56]
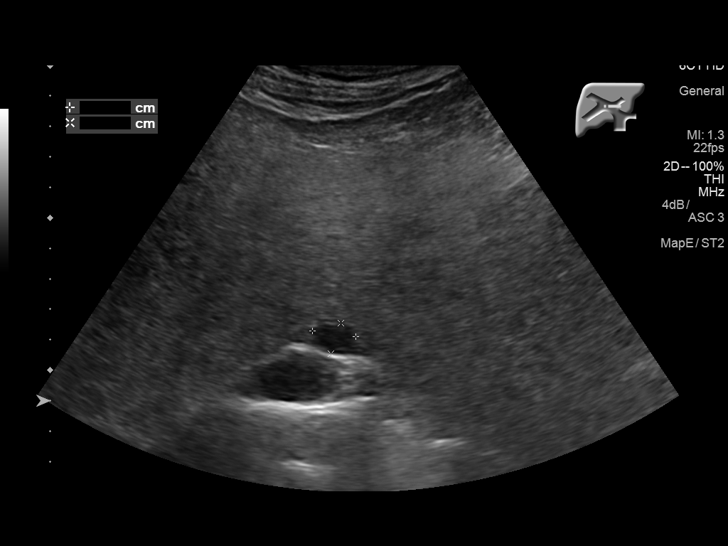
[im 35/56]
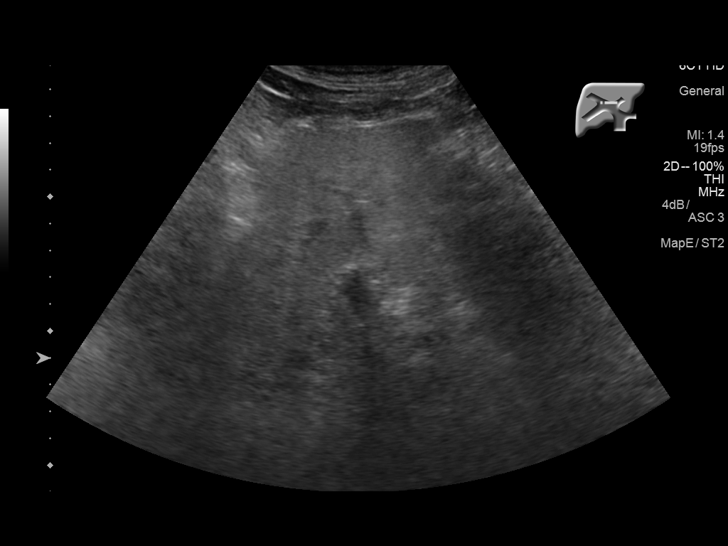
[im 37/56]
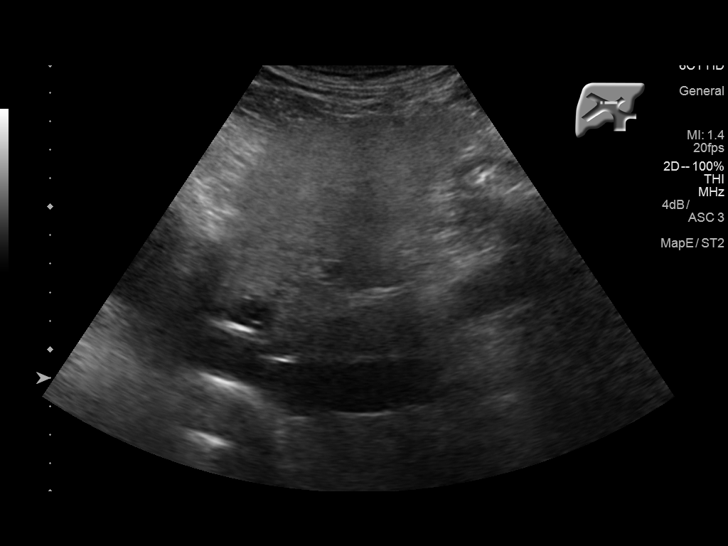
[im 42/56]
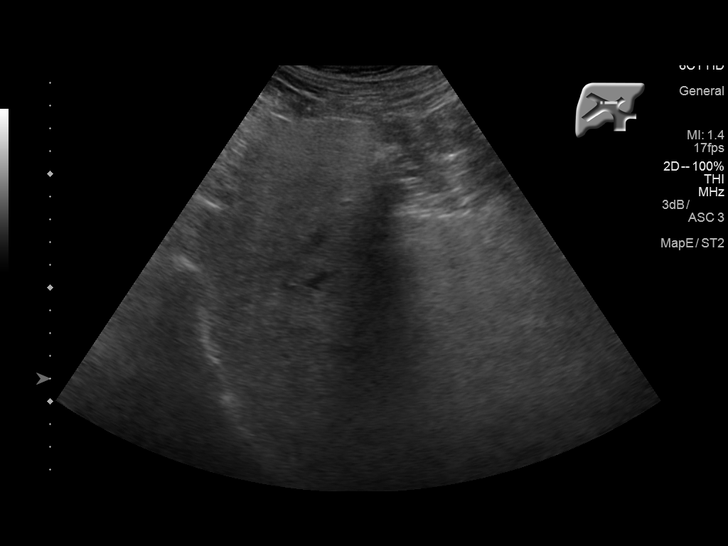
[im 46/56]
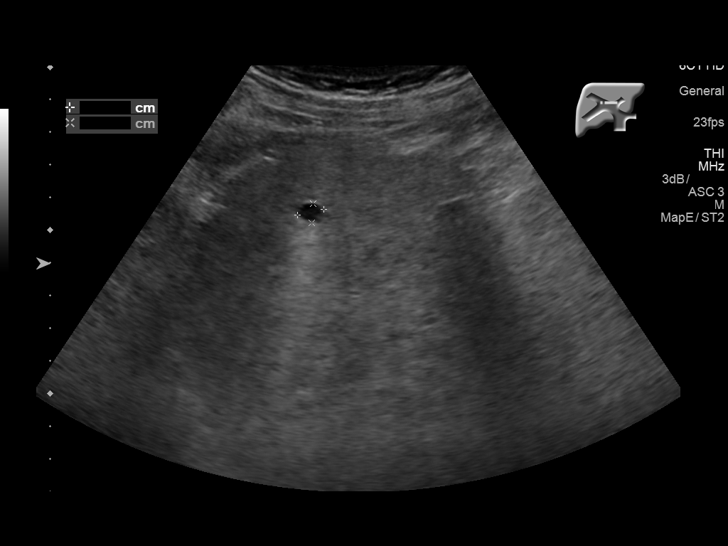
[im 51/56]
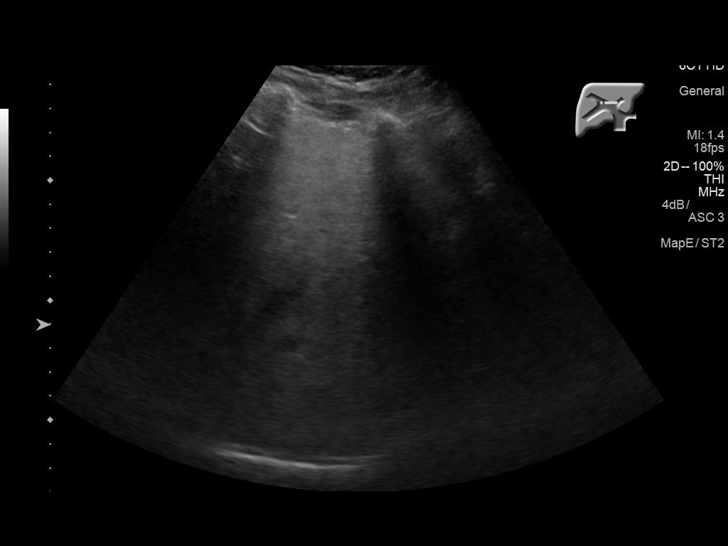
[im 56/56]
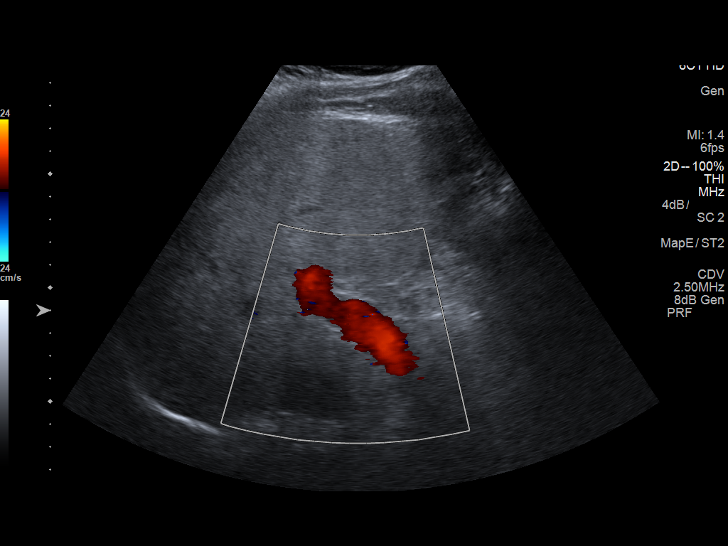

[14 of 25 positions shown; findings below may reference images not displayed]

FINDINGS: Gallbladder:

Prior cholecystectomy

Common bile duct:

Diameter: Normal caliber, 3 mm

Liver:

Cystic areas within the liver appear benign, the largest in the left
hepatic lobe measuring 1.8 cm. Diffusely increased echotexture
throughout the liver with nodularity of the liver surface compatible
with cirrhosis. Portal vein is patent on color Doppler imaging with
normal direction of blood flow towards the liver.
IMPRESSION: Changes of cirrhosis.  No suspicious focal hepatic abnormality.

Prior cholecystectomy.

## 2017-11-03 ENCOUNTER — Other Ambulatory Visit: Payer: Self-pay

## 2018-03-29 HISTORY — PX: LUMBAR DISC SURGERY: SHX700

## 2018-05-11 ENCOUNTER — Telehealth: Payer: Self-pay | Admitting: Gastroenterology

## 2018-05-11 ENCOUNTER — Encounter: Payer: Self-pay | Admitting: Gastroenterology

## 2018-05-11 NOTE — Telephone Encounter (Signed)
Please schedule his labs and RUQ ultrasound for the day he comes to see me in July.  I would not have the results for the visit, but at least it saves him the drive from [place].

## 2018-05-12 ENCOUNTER — Other Ambulatory Visit: Payer: Self-pay

## 2018-05-12 DIAGNOSIS — K7460 Unspecified cirrhosis of liver: Secondary | ICD-10-CM

## 2018-05-12 DIAGNOSIS — K7581 Nonalcoholic steatohepatitis (NASH): Principal | ICD-10-CM

## 2018-05-12 DIAGNOSIS — K760 Fatty (change of) liver, not elsewhere classified: Secondary | ICD-10-CM

## 2018-05-12 NOTE — Telephone Encounter (Signed)
RUQ Korea scheduled for 7/24 at 0900, labs in [place]. Messaged patient with information and reminder to be NPO after midnight.

## 2018-06-21 ENCOUNTER — Encounter: Payer: Self-pay | Admitting: Gastroenterology

## 2018-06-21 ENCOUNTER — Ambulatory Visit (HOSPITAL_COMMUNITY)
Admission: RE | Admit: 2018-06-21 | Discharge: 2018-06-21 | Disposition: A | Discharge status: Home / Self Care | Payer: Medicare Other | Source: Ambulatory Visit | Attending: Gastroenterology | Admitting: Gastroenterology

## 2018-06-21 ENCOUNTER — Ambulatory Visit (INDEPENDENT_AMBULATORY_CARE_PROVIDER_SITE_OTHER): Payer: Medicare Other | Admitting: Gastroenterology

## 2018-06-21 ENCOUNTER — Other Ambulatory Visit (INDEPENDENT_AMBULATORY_CARE_PROVIDER_SITE_OTHER): Payer: Medicare Other

## 2018-06-21 VITALS — BP 162/84 | HR 77 | Ht 69.0 in | Wt 227.0 lb

## 2018-06-21 DIAGNOSIS — Z9049 Acquired absence of other specified parts of digestive tract: Secondary | ICD-10-CM | POA: Diagnosis not present

## 2018-06-21 DIAGNOSIS — K7581 Nonalcoholic steatohepatitis (NASH): Secondary | ICD-10-CM | POA: Insufficient documentation

## 2018-06-21 DIAGNOSIS — K7460 Unspecified cirrhosis of liver: Secondary | ICD-10-CM

## 2018-06-21 DIAGNOSIS — K760 Fatty (change of) liver, not elsewhere classified: Secondary | ICD-10-CM

## 2018-06-21 LAB — CBC WITH DIFFERENTIAL/PLATELET
BASOS ABS: 0 10*3/uL (ref 0.0–0.1)
Basophils Relative: 0.6 % (ref 0.0–3.0)
Eosinophils Absolute: 0.1 10*3/uL (ref 0.0–0.7)
Eosinophils Relative: 4.1 % (ref 0.0–5.0)
HCT: 41.6 % (ref 39.0–52.0)
Hemoglobin: 14.6 g/dL (ref 13.0–17.0)
Lymphocytes Relative: 27.1 % (ref 12.0–46.0)
Lymphs Abs: 1 10*3/uL (ref 0.7–4.0)
MCHC: 35 g/dL (ref 30.0–36.0)
MCV: 92.1 fl (ref 78.0–100.0)
Monocytes Absolute: 0.4 10*3/uL (ref 0.1–1.0)
Monocytes Relative: 10.7 % (ref 3.0–12.0)
NEUTROS ABS: 2.1 10*3/uL (ref 1.4–7.7)
NEUTROS PCT: 57.5 % (ref 43.0–77.0)
PLATELETS: 97 10*3/uL — AB (ref 150.0–400.0)
RBC: 4.52 Mil/uL (ref 4.22–5.81)
RDW: 13 % (ref 11.5–15.5)
WBC: 3.7 10*3/uL — ABNORMAL LOW (ref 4.0–10.5)

## 2018-06-21 LAB — COMPREHENSIVE METABOLIC PANEL
ALT: 104 U/L — ABNORMAL HIGH (ref 0–53)
AST: 63 U/L — AB (ref 0–37)
Albumin: 4.7 g/dL (ref 3.5–5.2)
Alkaline Phosphatase: 49 U/L (ref 39–117)
BUN: 24 mg/dL — ABNORMAL HIGH (ref 6–23)
CO2: 27 meq/L (ref 19–32)
CREATININE: 1.01 mg/dL (ref 0.40–1.50)
Calcium: 9.6 mg/dL (ref 8.4–10.5)
Chloride: 102 mEq/L (ref 96–112)
GFR: 77.3 mL/min (ref >60.00)
Glucose, Bld: 120 mg/dL — ABNORMAL HIGH (ref 70–99)
Potassium: 4.4 mEq/L (ref 3.5–5.1)
Sodium: 138 mEq/L (ref 135–145)
Total Bilirubin: 0.5 mg/dL (ref 0.2–1.2)
Total Protein: 7.6 g/dL (ref 6.0–8.3)

## 2018-06-21 LAB — PROTIME-INR
INR: 1.1 ratio — AB (ref 0.8–1.0)
PROTHROMBIN TIME: 13.3 s — AB (ref 9.6–13.1)

## 2018-06-21 NOTE — Progress Notes (Signed)
[facility] Progress Note  Chief Complaint: Jeffery Pruitt related cirrhosis  Subjective  History:  Jeffery Pruitt sees me for the first time in nearly a year.  He continues to feel well, and travels extensively from October to April every year with his wife, when they travel to the [place] and also see 2 of their children in [place].  He denies abdominal pain, loss of appetite, nausea, vomiting dysphagia, change in bowel habits or rectal bleeding.  He had back pain in the spring and ultimately had lumbar disc surgery from which he has mostly recovered.  He had gained some weight during that period of inactivity, but has been able to take much of the back off, especially after recognizing how much added sugar there is in many foods. 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  ROS: Cardiovascular:  no chest pain Respiratory: no dyspnea Remainder of systems negative except as above The patient's Past Medical, Family and Social History were reviewed and are on file in the EMR.  Objective:  Med list reviewed  Current Outpatient Medications:  .  atorvastatin (LIPITOR) 10 MG tablet, TAKE 1 TABLET EVERY DAY  AT  6PM, Disp: 90 tablet, Rfl: 0 .  fexofenadine (ALLEGRA) 180 MG tablet, Take 180 mg by mouth daily as needed for allergies or rhinitis., Disp: , Rfl:  .  gabapentin (NEURONTIN) 600 MG tablet, Take 600 mg by mouth at bedtime., Disp: , Rfl:  .  methocarbamol (ROBAXIN) 500 MG tablet, Take 1 tablet by mouth 3 (three) times daily., Disp: , Rfl:  .  Multiple Vitamin (MULTIVITAMIN) tablet, Take 1 tablet by mouth daily., Disp: , Rfl:   Current Facility-Administered Medications:  .  0.9 %  sodium chloride infusion, 500 mL, Intravenous, Continuous, Danis, Estill Cotta III, MD   Vital signs in last 24 hrs: Vitals:   06/21/18 1107  BP: (!) 162/84  Pulse: 77    Physical Exam  He is well-appearing and in good  spirits  HEENT: sclera anicteric, oral mucosa moist without lesions  Neck: supple, no thyromegaly, JVD or lymphadenopathy  Cardiac: RRR without murmurs, S1S2 heard, no peripheral edema  Pulm: clear to auscultation bilaterally, normal RR and effort noted  Abdomen: soft, no tenderness, with active bowel sounds.  Left lobe of liver enlarged as before, no spleen tip palpable.  Skin; warm and dry, no jaundice or rash  Recent Labs:  CBC Latest Ref Rng & Units 06/21/2018 03/19/2016 03/09/2016  WBC 4.0 - 10.5 K/uL 3.7(L) 3.4 4.2  Hemoglobin 13.0 - 17.0 g/dL 14.6 14.7 15.0  Hematocrit 39.0 - 52.0 % 41.6 - 43.4  Platelets 150.0 - 400.0 K/uL 97.0(L) - 114.0(L)   CMP Latest Ref Rng & Units 06/21/2018 03/19/2016 03/09/2016  Glucose 70 - 99 mg/dL 120(H) - 130(H)  BUN 6 - 23 mg/dL 24(H) 19 19  Creatinine 0.40 - 1.50 mg/dL 1.01 1.1 1.12  Sodium 135 - 145 mEq/L 138 138 137  Potassium 3.5 - 5.1 mEq/L 4.4 4.1 4.7  Chloride 96 - 112 mEq/L 102 - 101  CO2 19 - 32 mEq/L 27 - 30  Calcium 8.4 - 10.5 mg/dL 9.6 - 10.2  Total Protein 6.0 - 8.3 g/dL 7.6 - 7.8  Total Bilirubin 0.2 - 1.2 mg/dL 0.5 0.9 1.0  Alkaline Phos 39 - 117 U/L 49 67 43  AST 0 - 37 U/L 63(H) 67 55(H)  ALT 0 - 53 U/L 104(H) 130  87(H)  Having INR 1.1  AFP from this AM pending - all labs reports and [place] report given to patient at visit.  EGD 06/2017 no varices  Radiologic studies:  [place] today - two small liver cysts, no suspicious lesions, cirrhosis appearance, normal portal flow  @ASSESSMENTPLANBEGIN @ Assessment: Encounter Diagnoses  Name Primary?  . Fatty liver Yes  . Liver cirrhosis secondary to NASH (HCC)    Stable, chronic, compensated cirrhosis from [place].  He has made some diet and lifestyle changes to reduce weight and cardiovascular risk.  This will also likely decrease risk of progression of his cirrhosis.  He is still at risk for hepatocellular carcinoma needs regular screening for that.  His lab work is stable, AFP  pending.   Plan: We will put in a six-month clinical reminder to contact him for labs.  He was also agreeable to calling our office around that time, early February 2020, when he will still be in [place].  We can probably make arrangements for him to get at least his lab work done at a lab in that area with results sent to me.  I would still prefer that he have his ultrasound done with our imaging center for consistency, though that will likely need to wait until he returns to the area in April unless there are some lab abnormalities or other clinical changes indicating he should be done sooner.   Total time 30 minutes, over half spent face-to-face with patient in counseling and coordination of care.   Nelida Meuse III

## 2018-06-21 NOTE — Patient Instructions (Signed)
If you are age 71 or older, your body mass index should be between 23-30. Your Body mass index is 33.52 kg/m. If this is out of the aforementioned range listed, please consider follow up with your Primary Care Provider.  If you are age 44 or younger, your body mass index should be between 19-25. Your Body mass index is 33.52 kg/m. If this is out of the aformentioned range listed, please consider follow up with your Primary Care Provider.   It was a pleasure to meet you today!  Dr. [name]

## 2018-06-22 LAB — AFP TUMOR MARKER: AFP TUMOR MARKER: 4.8 ng/mL (ref <6.1)

## 2018-08-13 IMAGING — US US ABDOMEN LIMITED
1 series · 14 of 25 positions shown · non-contrast
Comparison: 07/25/2017.

CLINICAL DATA: Liver cirrhosis, secondary to NASH. Continued
surveillance.

EXAM:
ULTRASOUND ABDOMEN LIMITED RIGHT UPPER QUADRANT

[Series 1: us abdomen limited · 0.33mm/px · 14 of 46 slices shown]
[im 1/46]
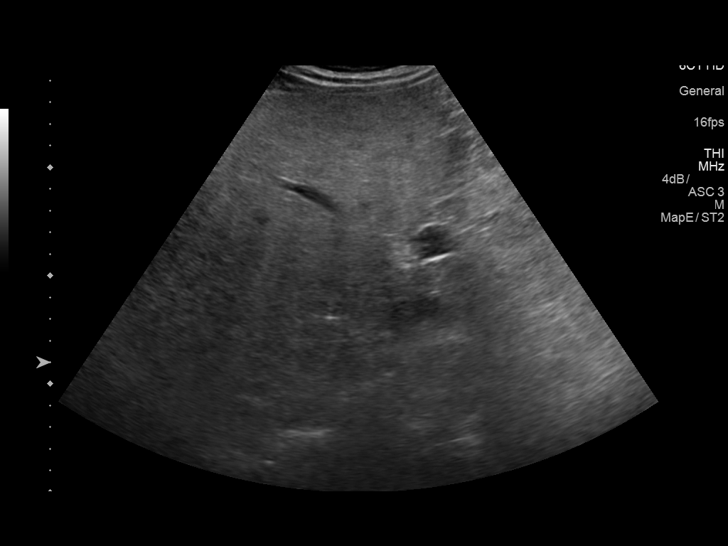
[im 4/46]
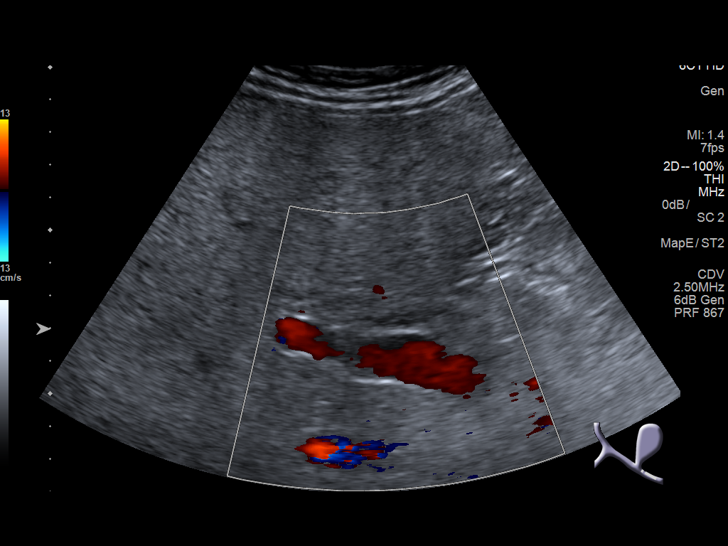
[im 8/46]
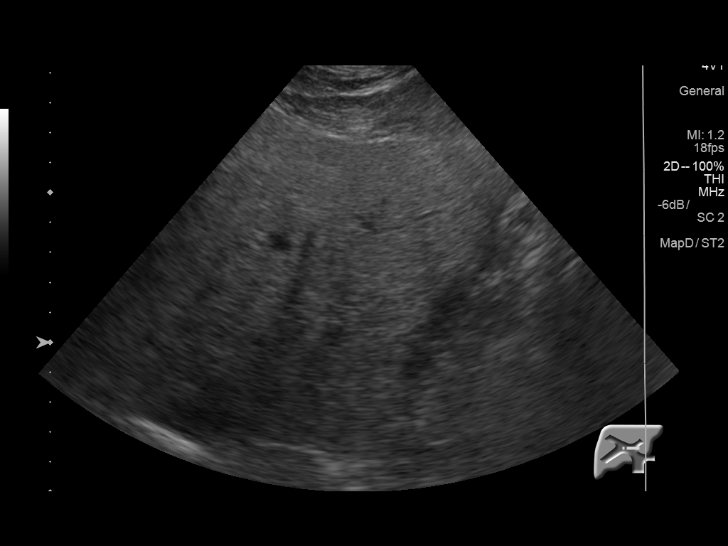
[im 12/46]
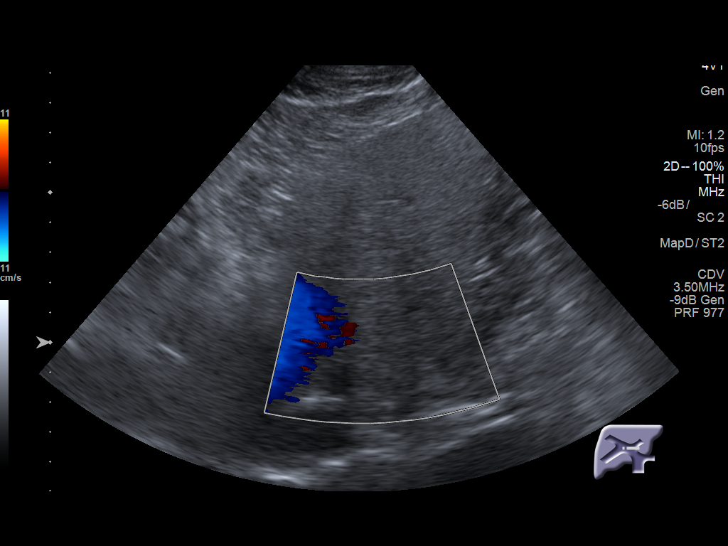
[im 16/46]
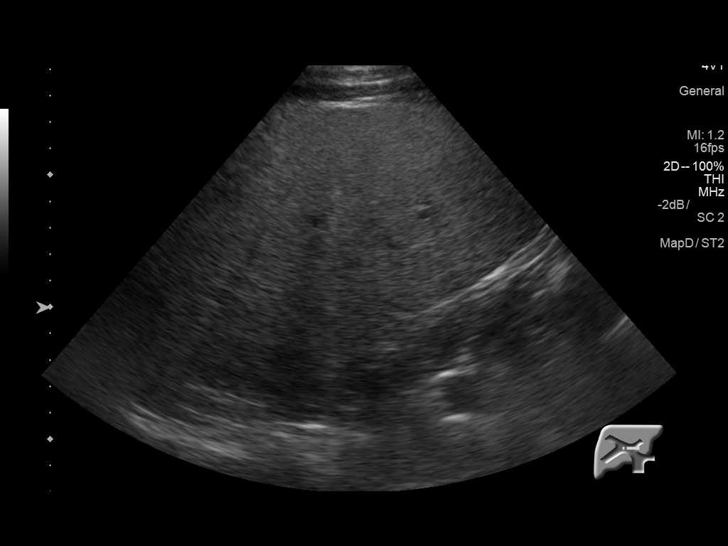
[im 17/46]
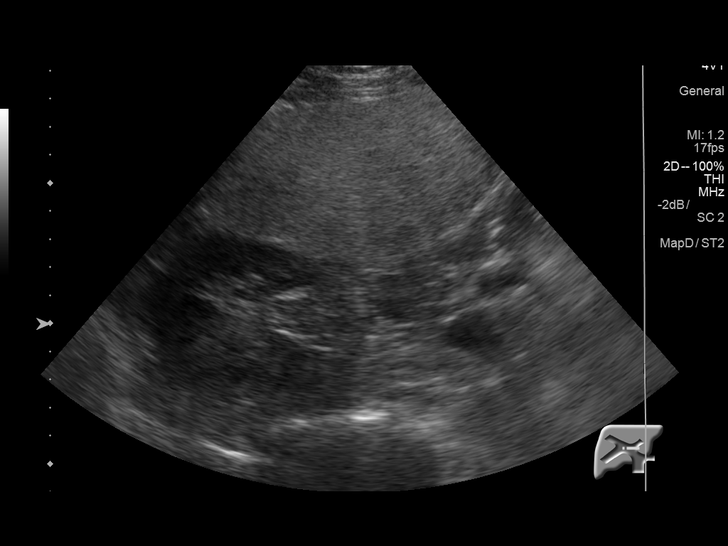
[im 21/46]
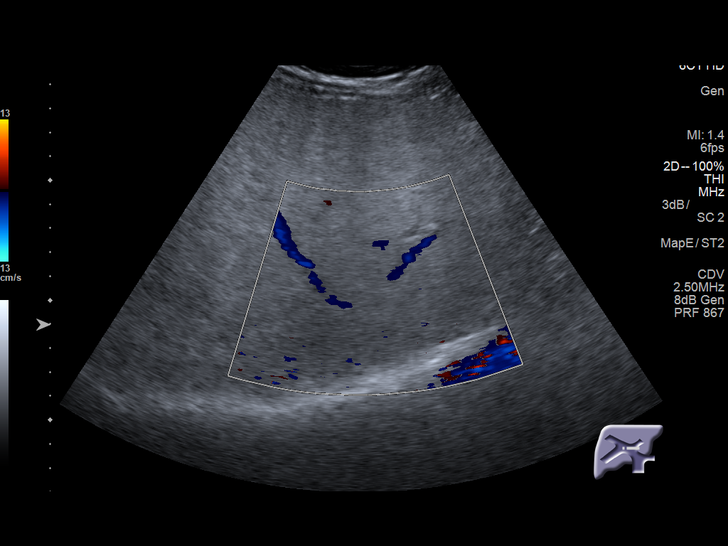
[im 25/46]
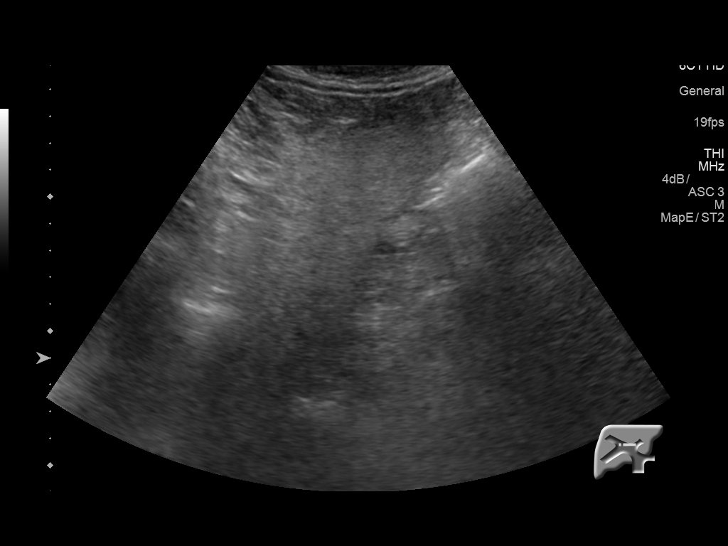
[im 29/46]
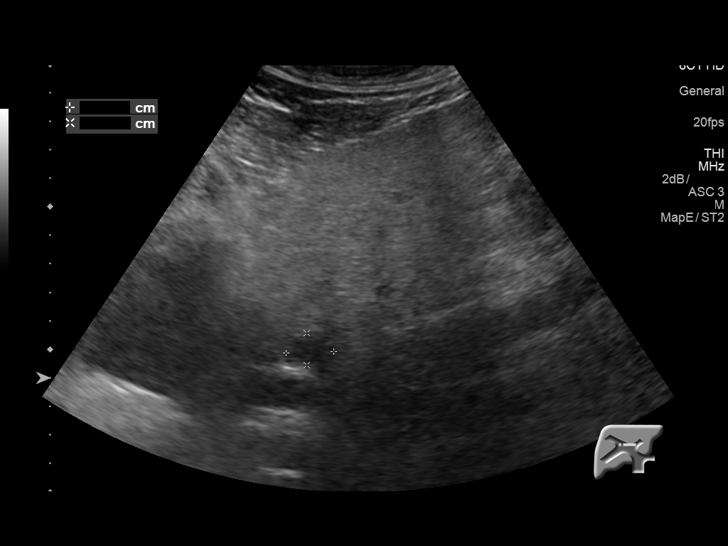
[im 31/46]
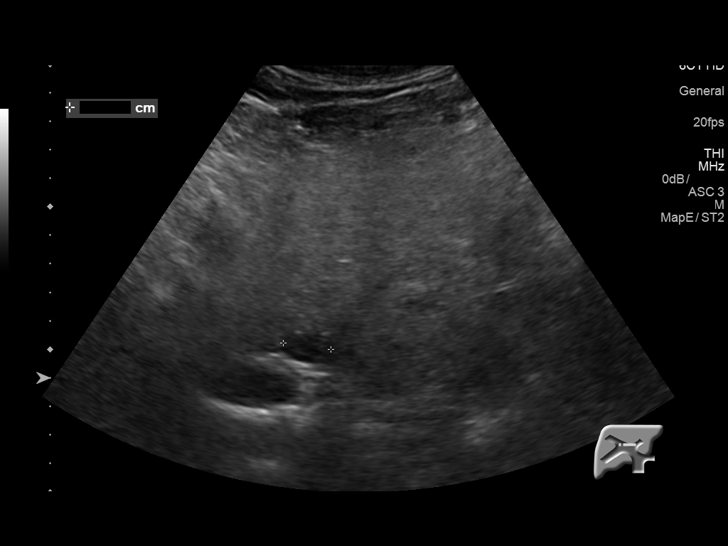
[im 34/46]
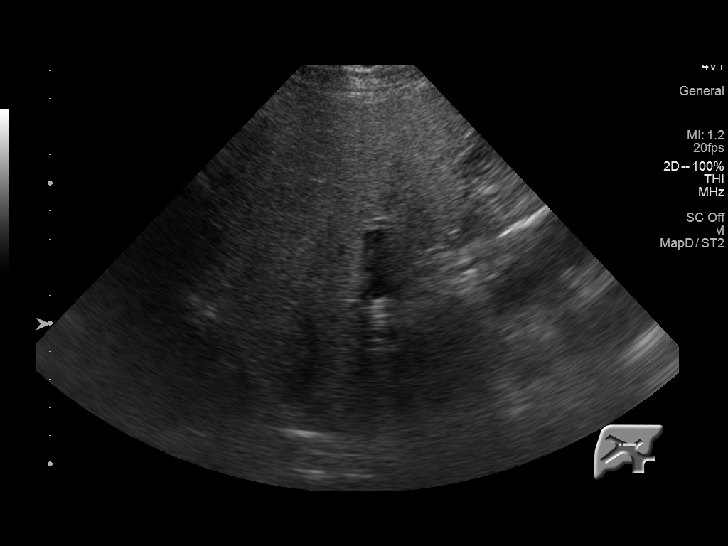
[im 38/46]
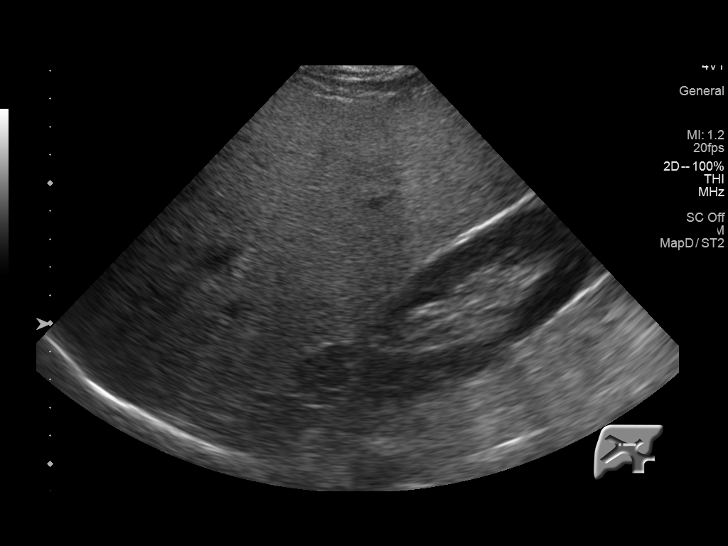
[im 42/46]
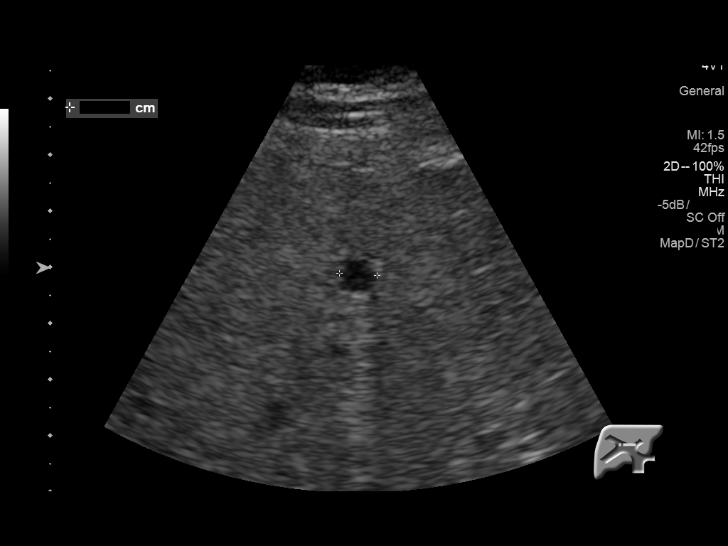
[im 46/46]
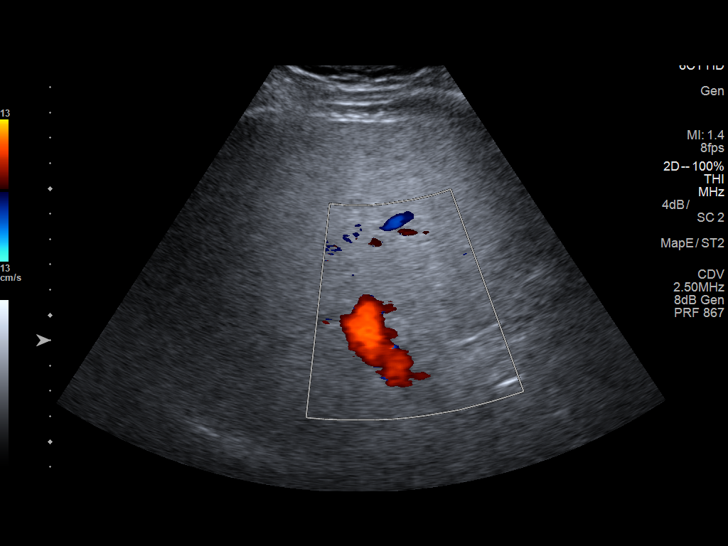

[14 of 25 positions shown; findings below may reference images not displayed]

FINDINGS: Gallbladder:

Cholecystectomy.

Common bile duct:

Diameter: Normal, 4 mm.

Liver:

Coarse echotexture, increased echogenicity with nodular contour. 2
hepatic cysts are present, measuring 0.7 x 1.1 x 1.7 cm in 0.6 x
x 0.7 cm. Portal vein is patent on color Doppler imaging with normal
direction of blood flow towards the liver.

Compared with priors, similar appearance.
IMPRESSION: Cholecystectomy. Changes of cirrhosis. No suspicious focal
abnormality.

Portal vein is patent with normal direction of blood flow towards
the liver.

## 2019-01-08 NOTE — Telephone Encounter (Signed)
Dr [name] the pt was advised to have labs around this time.  He is in [place] and I will fax the order to [facility].  What labs do you want drawn?

## 2019-01-10 ENCOUNTER — Telehealth: Payer: Self-pay | Admitting: Gastroenterology

## 2019-01-10 DIAGNOSIS — K760 Fatty (change of) liver, not elsewhere classified: Secondary | ICD-10-CM

## 2019-01-10 NOTE — Telephone Encounter (Signed)
Pt aware that lab order faxed

## 2019-01-10 NOTE — Telephone Encounter (Signed)
Lab order faxed to [facility] as requested

## 2019-01-10 NOTE — Telephone Encounter (Signed)
Thanks for sending me his note re: lab draw.  I would like him to have: CBC, CMP, alpha fetoprotein Dx: fatty liver cirrhosis

## 2019-01-15 ENCOUNTER — Other Ambulatory Visit: Payer: Self-pay

## 2019-01-16 LAB — CBC WITH DIFFERENTIAL/PLATELET
BASOS: 0 %
Basophils Absolute: 0 10*3/uL (ref 0.0–0.2)
EOS (ABSOLUTE): 0.1 10*3/uL (ref 0.0–0.4)
EOS: 3 %
Hematocrit: 42.4 % (ref 37.5–51.0)
Hemoglobin: 14.3 g/dL (ref 13.0–17.7)
IMMATURE GRANULOCYTES: 1 %
Immature Grans (Abs): 0 10*3/uL (ref 0.0–0.1)
Lymphocytes Absolute: 0.9 10*3/uL (ref 0.7–3.1)
Lymphs: 26 %
MCH: 31 pg (ref 26.6–33.0)
MCHC: 33.7 g/dL (ref 31.5–35.7)
MCV: 92 fL (ref 79–97)
MONOS ABS: 0.2 10*3/uL (ref 0.1–0.9)
Monocytes: 7 %
NEUTROS PCT: 63 %
Neutrophils Absolute: 2.2 10*3/uL (ref 1.4–7.0)
PLATELETS: 102 10*3/uL — AB (ref 150–450)
RBC: 4.61 x10E6/uL (ref 4.14–5.80)
RDW: 14.4 % (ref 11.6–15.4)
WBC: 3.5 10*3/uL (ref 3.4–10.8)

## 2019-01-16 LAB — COMPREHENSIVE METABOLIC PANEL
ALT: 94 IU/L — AB (ref 0–44)
AST: 69 IU/L — AB (ref 0–40)
Albumin/Globulin Ratio: 2 (ref 1.2–2.2)
Albumin: 4.5 g/dL (ref 3.7–4.7)
Alkaline Phosphatase: 55 IU/L (ref 39–117)
BUN/Creatinine Ratio: 20 (ref 10–24)
BUN: 17 mg/dL (ref 8–27)
Bilirubin Total: 0.5 mg/dL (ref 0.0–1.2)
CALCIUM: 9.4 mg/dL (ref 8.6–10.2)
CO2: 22 mmol/L (ref 20–29)
Chloride: 102 mmol/L (ref 96–106)
Creatinine, Ser: 0.87 mg/dL (ref 0.76–1.27)
GFR, EST AFRICAN AMERICAN: 100 mL/min/{1.73_m2} (ref >59)
GFR, EST NON AFRICAN AMERICAN: 86 mL/min/{1.73_m2} (ref >59)
Globulin, Total: 2.3 g/dL (ref 1.5–4.5)
Glucose: 121 mg/dL — ABNORMAL HIGH (ref 65–99)
Potassium: 4.5 mmol/L (ref 3.5–5.2)
Sodium: 141 mmol/L (ref 134–144)
TOTAL PROTEIN: 6.8 g/dL (ref 6.0–8.5)

## 2019-01-16 LAB — AFP TUMOR MARKER: AFP, Serum, Tumor Marker: 4.9 ng/mL (ref 0.0–8.3)

## 2019-03-21 ENCOUNTER — Encounter: Payer: Self-pay | Admitting: Gastroenterology

## 2019-04-16 ENCOUNTER — Telehealth: Payer: Self-pay | Admitting: Gastroenterology

## 2019-04-16 NOTE — Telephone Encounter (Signed)
Pt wants to know if he could have his in-person annual check-up in June or if Dr. [name] prefers him to wait, he states that he also needs blood work and [place]. Pls contact pt either by phone or mychart.

## 2019-04-16 NOTE — Telephone Encounter (Signed)
My Chart message sent

## 2019-06-12 ENCOUNTER — Telehealth: Payer: Self-pay | Admitting: Gastroenterology

## 2019-06-12 NOTE — Telephone Encounter (Signed)
Patient calling stating he is due for labs and follow-up Ultrasound of abd. Pt had labs drawn in February. Please advise.

## 2019-06-13 ENCOUNTER — Other Ambulatory Visit: Payer: Self-pay

## 2019-06-13 DIAGNOSIS — K7460 Unspecified cirrhosis of liver: Secondary | ICD-10-CM

## 2019-06-13 DIAGNOSIS — K7581 Nonalcoholic steatohepatitis (NASH): Secondary | ICD-10-CM

## 2019-06-13 NOTE — Telephone Encounter (Signed)
Lab orders in epic, pt scheduled for [place] at [facility] 06/19/19@10am , pt to arrive at 9:45am and be NPO 6 hours prior to exam. Telehealth visit scheduled with Dr. [name] 07/04/19@4pm . Pt aware of appts.

## 2019-06-13 NOTE — Telephone Encounter (Signed)
Yes, he has cirrhosis.  Please arrange CMP, CBC, INR and AFP (cirrhosis) as well as RUQ ultrasound for cirrhosis/screen for HCC.  Also needs telemedicine follow up appointment with me about a week after those studies are done. (he lives about 2.5 hrs away from here)

## 2019-06-19 ENCOUNTER — Other Ambulatory Visit: Payer: Self-pay

## 2019-06-19 ENCOUNTER — Other Ambulatory Visit (INDEPENDENT_AMBULATORY_CARE_PROVIDER_SITE_OTHER): Payer: Medicare Other

## 2019-06-19 ENCOUNTER — Ambulatory Visit (HOSPITAL_COMMUNITY)
Admission: RE | Admit: 2019-06-19 | Discharge: 2019-06-19 | Disposition: A | Discharge status: Home / Self Care | Payer: Medicare Other | Source: Ambulatory Visit | Attending: Gastroenterology | Admitting: Gastroenterology

## 2019-06-19 DIAGNOSIS — K7581 Nonalcoholic steatohepatitis (NASH): Secondary | ICD-10-CM

## 2019-06-19 DIAGNOSIS — K7460 Unspecified cirrhosis of liver: Secondary | ICD-10-CM | POA: Insufficient documentation

## 2019-06-19 LAB — COMPREHENSIVE METABOLIC PANEL
ALT: 41 U/L (ref 0–53)
AST: 36 U/L (ref 0–37)
Albumin: 4.6 g/dL (ref 3.5–5.2)
Alkaline Phosphatase: 43 U/L (ref 39–117)
BUN: 24 mg/dL — ABNORMAL HIGH (ref 6–23)
CO2: 27 mEq/L (ref 19–32)
Calcium: 9.6 mg/dL (ref 8.4–10.5)
Chloride: 104 mEq/L (ref 96–112)
Creatinine, Ser: 1 mg/dL (ref 0.40–1.50)
GFR: 73.36 mL/min (ref >60.00)
Glucose, Bld: 129 mg/dL — ABNORMAL HIGH (ref 70–99)
Potassium: 4.2 mEq/L (ref 3.5–5.1)
Sodium: 140 mEq/L (ref 135–145)
Total Bilirubin: 0.5 mg/dL (ref 0.2–1.2)
Total Protein: 6.9 g/dL (ref 6.0–8.3)

## 2019-06-19 LAB — CBC WITH DIFFERENTIAL/PLATELET
Basophils Absolute: 0 10*3/uL (ref 0.0–0.1)
Basophils Relative: 0.7 % (ref 0.0–3.0)
Eosinophils Absolute: 0 10*3/uL (ref 0.0–0.7)
Eosinophils Relative: 0.5 % (ref 0.0–5.0)
HCT: 41.5 % (ref 39.0–52.0)
Hemoglobin: 14.2 g/dL (ref 13.0–17.0)
Lymphocytes Relative: 12.7 % (ref 12.0–46.0)
Lymphs Abs: 0.6 10*3/uL — ABNORMAL LOW (ref 0.7–4.0)
MCHC: 34.3 g/dL (ref 30.0–36.0)
MCV: 91.5 fl (ref 78.0–100.0)
Monocytes Absolute: 0.2 10*3/uL (ref 0.1–1.0)
Monocytes Relative: 3.6 % (ref 3.0–12.0)
Neutro Abs: 3.6 10*3/uL (ref 1.4–7.7)
Neutrophils Relative %: 82.5 % — ABNORMAL HIGH (ref 43.0–77.0)
Platelets: 85 10*3/uL — ABNORMAL LOW (ref 150.0–400.0)
RBC: 4.53 Mil/uL (ref 4.22–5.81)
RDW: 13.5 % (ref 11.5–15.5)
WBC: 4.4 10*3/uL (ref 4.0–10.5)

## 2019-06-19 LAB — PROTIME-INR
INR: 1.1 ratio — ABNORMAL HIGH (ref 0.8–1.0)
Prothrombin Time: 12.8 s (ref 9.6–13.1)

## 2019-06-20 LAB — AFP TUMOR MARKER: AFP-Tumor Marker: 3.9 ng/mL (ref <6.1)

## 2019-07-02 ENCOUNTER — Telehealth: Payer: Self-pay | Admitting: Gastroenterology

## 2019-07-02 NOTE — Telephone Encounter (Signed)
Patient reports he had a message stating he was being changed to an in office visit but the patient lives 2 hours away and cannot have an in person office visit. Televisit confirmed with the patient for 8/6 at 4:00 pm.

## 2019-07-04 ENCOUNTER — Ambulatory Visit (INDEPENDENT_AMBULATORY_CARE_PROVIDER_SITE_OTHER): Payer: Medicare Other | Admitting: Gastroenterology

## 2019-07-04 ENCOUNTER — Encounter: Payer: Self-pay | Admitting: Gastroenterology

## 2019-07-04 DIAGNOSIS — K760 Fatty (change of) liver, not elsewhere classified: Secondary | ICD-10-CM

## 2019-07-04 DIAGNOSIS — K7460 Unspecified cirrhosis of liver: Secondary | ICD-10-CM | POA: Diagnosis not present

## 2019-07-04 DIAGNOSIS — K7581 Nonalcoholic steatohepatitis (NASH): Secondary | ICD-10-CM

## 2019-07-04 NOTE — Progress Notes (Signed)
This patient contacted our office requesting a physician telemedicine consultation regarding clinical questions and/or test results. Due to COVID restrictions, this was felt to be the most appropriate method of patient evaluation.   Participants on the conference : myself and patient   The patient consented to this consultation and was aware that a charge will be placed through their insurance.  They were also made aware of the limitations of telemedicine.  I was in my office and the patient was at home.   Encounter time:  Total time 20 minutes, with 17 minutes spent with patient on D[employer]   _____________________________________________________________________________________________               [facility] Progress Note  Chief Complaint: Cirrhosis from NASH  Subjective  History:  Jeffery Pruitt follows up for his compensated cirrhosis.  He has been feeling well, spent much of the winter in [place] and [place] traveling in their motor home, and travel to [place] in [place] in January before [place] restrictions occurred.  He is feeling very well, and because they have been home, he is walking more and eating healthier.  With that, he has lost 15 to 20 pounds in the last couple of months and wants to try to lose another 10-15. He denies abdominal pain, vomiting, bloody stool or change in bowel habits.  ROS: Cardiovascular:  no chest pain Respiratory: no dyspnea  The patient's Past Medical, Family and Social History were reviewed and are on file in the EMR.  Objective:  Med list reviewed  Current Outpatient Medications:  .  atorvastatin (LIPITOR) 10 MG tablet, TAKE 1 TABLET EVERY DAY  AT  6PM, Disp: 90 tablet, Rfl: 0 .  fexofenadine (ALLEGRA) 180 MG tablet, Take 180 mg by mouth daily as needed for allergies or rhinitis., Disp: , Rfl:  .  gabapentin (NEURONTIN) 600 MG tablet, Take 600 mg by mouth at bedtime., Disp: , Rfl:  .  methocarbamol (ROBAXIN) 500 MG tablet, Take 1  tablet by mouth 3 (three) times daily., Disp: , Rfl:  .  Multiple Vitamin (MULTIVITAMIN) tablet, Take 1 tablet by mouth daily., Disp: , Rfl:   Current Facility-Administered Medications:  .  0.9 %  sodium chloride infusion, 500 mL, Intravenous, Continuous, Danis, Estill Cotta III, MD   Vital signs in last 24 hrs: No exam-virtual visit He is well-appearing and in good spirits.  Recent Labs:  CBC Latest Ref Rng & Units 06/19/2019 01/15/2019 06/21/2018  WBC 4.0 - 10.5 K/uL 4.4 3.5 3.7(L)  Hemoglobin 13.0 - 17.0 g/dL 14.2 14.3 14.6  Hematocrit 39.0 - 52.0 % 41.5 42.4 41.6  Platelets 150.0 - 400.0 K/uL 85.0(L) 102(L) 97.0(L)   CMP Latest Ref Rng & Units 06/19/2019 01/15/2019 06/21/2018  Glucose 70 - 99 mg/dL 129(H) 121(H) 120(H)  BUN 6 - 23 mg/dL 24(H) 17 24(H)  Creatinine 0.40 - 1.50 mg/dL 1.00 0.87 1.01  Sodium 135 - 145 mEq/L 140 141 138  Potassium 3.5 - 5.1 mEq/L 4.2 4.5 4.4  Chloride 96 - 112 mEq/L 104 102 102  CO2 19 - 32 mEq/L 27 22 27   Calcium 8.4 - 10.5 mg/dL 9.6 9.4 9.6  Total Protein 6.0 - 8.3 g/dL 6.9 6.8 7.6  Total Bilirubin 0.2 - 1.2 mg/dL 0.5 0.5 0.5  Alkaline Phos 39 - 117 U/L 43 55 49  AST 0 - 37 U/L 36 69(H) 63(H)  ALT 0 - 53 U/L 41 94(H) 104(H)   INR 1.1  AFP 3.9  Radiologic studies:  CLINICAL DATA:  Hepatic cirrhosis.  EXAM: ABDOMEN ULTRASOUND COMPLETE  COMPARISON:  Ultrasound of June 21, 2018.  FINDINGS: Gallbladder: Status post cholecystectomy.  Common bile duct: Diameter: 4 mm which is within normal limits.  Liver: No focal lesion identified. Heterogeneous echotexture of hepatic parenchyma is noted with nodular contours suggesting hepatic cirrhosis. Portal vein is patent on color Doppler imaging with normal direction of blood flow towards the liver.  IVC: No abnormality visualized.  Pancreas: Visualized portion unremarkable.  Spleen: Size and appearance within normal limits.  Right Kidney: Length: 11.7 cm. Echogenicity within normal  limits. No mass or hydronephrosis visualized.  Left Kidney: Length: 11.3 cm. Echogenicity within normal limits. No mass or hydronephrosis visualized.  Abdominal aorta: No aneurysm visualized.  Other findings: None.  IMPRESSION: Stable findings consistent with hepatic cirrhosis. No definite focal sonographic hepatic abnormality is noted. Status post cholecystectomy. No other abnormality seen in the abdomen.   Electronically Signed   By: Marijo Conception M.D.   On: 06/19/2019 14:57  @ASSESSMENTPLANBEGIN @ Assessment: Encounter Diagnoses  Name Primary?  . Liver cirrhosis secondary to NASH ([place]) Yes  . Fatty liver    Compensated cirrhosis from fatty liver.  We reviewed his test results, and I was encouraged by the normalization of his LFTs.  That may be from healthier diet and weight loss.  He was concerned about a drop in platelets, but I reminded him that his count was 97 a year ago, then 102 in February and now 3.  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  No worrisome findings on imaging, AFP normal.  Plan: Follow-up in about 6 months for lab work and screening ultrasound. Advised to get a flu shot in the fall, which he does every year.    Nelida Meuse III

## 2020-06-20 ENCOUNTER — Other Ambulatory Visit: Payer: Self-pay

## 2020-06-20 DIAGNOSIS — K7460 Unspecified cirrhosis of liver: Secondary | ICD-10-CM

## 2020-06-20 NOTE — Telephone Encounter (Signed)
Thanks for the note.  Yes, this is a patient of mine with cirrhosis.  Needs:  CBC, CMP, INR, AFP RUQ ultrasound for cirrhosis and HC screening.  - HD

## 2020-06-26 ENCOUNTER — Telehealth: Payer: Self-pay | Admitting: Gastroenterology

## 2020-06-26 NOTE — Telephone Encounter (Signed)
This patient is coming to see me for cirrhosis follow up on August 9th.  He is having labs and an Ultrasound earlier that day to make it easier since he lives hours away from here.  He came up on recall because he is due for an EGD to screen for varices (last done August 2018).  Please contact him and see if he would like to do the EGD on Tues, August 10th.  Since he is coming all this way, perhaps he wants to stay overnight after the office visit to have EGD the following day.  As it stands now, there is one afternoon procedure slot left on the 10th.  Whatever he wants to do is fine - just might save him time and trouble.  - HD

## 2020-06-27 NOTE — Telephone Encounter (Signed)
Patient has declined the offer at this time. He and his wife are on the road a lot and cannot do the Aug10th. He will schedule the EGD when he's in the office on the Aug 9th date. He wanted to thank you for thinking of this, and apologizes for declining.

## 2020-06-27 NOTE — Telephone Encounter (Signed)
Patient appears to have changed his mind on the EGD for Aug 10th  - HD

## 2020-06-27 NOTE — Telephone Encounter (Signed)
Pt is calling wanting to inform Dr [name] that he will be able to do the procedure on Aug 10th after all.

## 2020-06-30 ENCOUNTER — Other Ambulatory Visit: Payer: Self-pay

## 2020-06-30 DIAGNOSIS — K760 Fatty (change of) liver, not elsewhere classified: Secondary | ICD-10-CM

## 2020-06-30 DIAGNOSIS — K7460 Unspecified cirrhosis of liver: Secondary | ICD-10-CM

## 2020-07-07 ENCOUNTER — Other Ambulatory Visit: Payer: Self-pay

## 2020-07-07 ENCOUNTER — Encounter: Payer: Self-pay | Admitting: Gastroenterology

## 2020-07-07 ENCOUNTER — Ambulatory Visit (HOSPITAL_COMMUNITY)
Admission: RE | Admit: 2020-07-07 | Discharge: 2020-07-07 | Disposition: A | Discharge status: Home / Self Care | Payer: Medicare Other | Source: Ambulatory Visit | Attending: Gastroenterology | Admitting: Gastroenterology

## 2020-07-07 ENCOUNTER — Other Ambulatory Visit (INDEPENDENT_AMBULATORY_CARE_PROVIDER_SITE_OTHER): Payer: Medicare Other

## 2020-07-07 ENCOUNTER — Ambulatory Visit (INDEPENDENT_AMBULATORY_CARE_PROVIDER_SITE_OTHER): Payer: Medicare Other | Admitting: Gastroenterology

## 2020-07-07 ENCOUNTER — Ambulatory Visit (HOSPITAL_COMMUNITY): Payer: Medicare Other

## 2020-07-07 VITALS — BP 110/70 | HR 80 | Ht 70.0 in | Wt 218.0 lb

## 2020-07-07 DIAGNOSIS — K7460 Unspecified cirrhosis of liver: Secondary | ICD-10-CM | POA: Diagnosis present

## 2020-07-07 DIAGNOSIS — D696 Thrombocytopenia, unspecified: Secondary | ICD-10-CM

## 2020-07-07 DIAGNOSIS — K7581 Nonalcoholic steatohepatitis (NASH): Secondary | ICD-10-CM | POA: Insufficient documentation

## 2020-07-07 LAB — CBC WITH DIFFERENTIAL/PLATELET
Basophils Absolute: 0 10*3/uL (ref 0.0–0.1)
Basophils Relative: 0.6 % (ref 0.0–3.0)
Eosinophils Absolute: 0.2 10*3/uL (ref 0.0–0.7)
Eosinophils Relative: 3.6 % (ref 0.0–5.0)
HCT: 41.7 % (ref 39.0–52.0)
Hemoglobin: 14.4 g/dL (ref 13.0–17.0)
Lymphocytes Relative: 28.5 % (ref 12.0–46.0)
Lymphs Abs: 1.4 10*3/uL (ref 0.7–4.0)
MCHC: 34.5 g/dL (ref 30.0–36.0)
MCV: 91.9 fl (ref 78.0–100.0)
Monocytes Absolute: 0.4 10*3/uL (ref 0.1–1.0)
Monocytes Relative: 7.9 % (ref 3.0–12.0)
Neutro Abs: 3 10*3/uL (ref 1.4–7.7)
Neutrophils Relative %: 59.4 % (ref 43.0–77.0)
Platelets: 101 10*3/uL — ABNORMAL LOW (ref 150.0–400.0)
RBC: 4.54 Mil/uL (ref 4.22–5.81)
RDW: 13.9 % (ref 11.5–15.5)
WBC: 5 10*3/uL (ref 4.0–10.5)

## 2020-07-07 LAB — COMPREHENSIVE METABOLIC PANEL
ALT: 97 U/L — ABNORMAL HIGH (ref 0–53)
AST: 61 U/L — ABNORMAL HIGH (ref 0–37)
Albumin: 4.3 g/dL (ref 3.5–5.2)
Alkaline Phosphatase: 43 U/L (ref 39–117)
BUN: 21 mg/dL (ref 6–23)
CO2: 27 mEq/L (ref 19–32)
Calcium: 9.4 mg/dL (ref 8.4–10.5)
Chloride: 100 mEq/L (ref 96–112)
Creatinine, Ser: 0.9 mg/dL (ref 0.40–1.50)
GFR: 82.61 mL/min (ref >60.00)
Glucose, Bld: 125 mg/dL — ABNORMAL HIGH (ref 70–99)
Potassium: 4.2 mEq/L (ref 3.5–5.1)
Sodium: 135 mEq/L (ref 135–145)
Total Bilirubin: 0.7 mg/dL (ref 0.2–1.2)
Total Protein: 6.9 g/dL (ref 6.0–8.3)

## 2020-07-07 LAB — PROTIME-INR
INR: 1 ratio (ref 0.8–1.0)
Prothrombin Time: 11.4 s (ref 9.6–13.1)

## 2020-07-07 NOTE — Progress Notes (Addendum)
[facility] Progress Note  Chief Complaint: [name] cirrhosis, compensated  Subjective  History: Last follow-up visit by telemedicine on 07/04/2019, Robet was doing well.  Uthman has been well since I saw him a year ago.  He and his wife still spend about half the year at their home in [place], [place] and the other half of the year traveling in the [place] and [place] visiting their children.  He had a lower back slipped disc over the winter, managed to recover with conservative therapy and bed rest, but gained about 10 pounds during that time.  He has now been more active and able to work on losing that weight.  He denies abdominal pain, rectal bleeding, nausea, vomiting, or dysphagia. He has also on antihypertensives since I saw him last.  ROS: Cardiovascular:  no chest pain Respiratory: no dyspnea Intermittent low back pain Seasonal allergies Remainder of systems negative except as above The patient's Past Medical, Family and Social History were reviewed and are on file in the EMR.  Objective:  Med list reviewed  Current Outpatient Medications:  .  amLODipine (NORVASC) 5 MG tablet, Take 5 mg by mouth daily., Disp: , Rfl:  .  ascorbic acid (VITAMIN C) 1000 MG tablet, Take 1 tablet by mouth daily., Disp: , Rfl:  .  atorvastatin (LIPITOR) 10 MG tablet, TAKE 1 TABLET EVERY DAY  AT  6PM, Disp: 90 tablet, Rfl: 0 .  Cholecalciferol (VITAMIN D-3) 25 MCG (1000 UT) CAPS, Take by mouth., Disp: , Rfl:  .  COLLAGEN PO, Take by mouth. With Biotin, Disp: , Rfl:  .  cyanocobalamin 1000 MCG tablet, Take 1 tablet by mouth daily., Disp: , Rfl:  .  cyclobenzaprine (FLEXERIL) 10 MG tablet, Take 10 mg by mouth daily as needed for muscle spasms., Disp: , Rfl:  .  Diclofenac Sodium 2 % SOLN, Apply topically daily as needed., Disp: , Rfl:  .  fexofenadine (ALLEGRA) 180 MG tablet, Take 180 mg by mouth daily as needed for allergies or rhinitis., Disp: , Rfl:  .  gabapentin (NEURONTIN) 300 MG capsule, Take by  mouth daily as needed., Disp: , Rfl:  .  ibuprofen (ADVIL) 800 MG tablet, Take 800 mg by mouth as needed., Disp: , Rfl:  .  lisinopril (ZESTRIL) 40 MG tablet, Take 40 mg by mouth daily., Disp: , Rfl:  .  Magnesium 250 MG TABS, Take 1 tablet by mouth in the morning and at bedtime., Disp: , Rfl:  .  meloxicam (MOBIC) 15 MG tablet, Take 15 mg by mouth daily as needed for pain., Disp: , Rfl:  .  methocarbamol (ROBAXIN) 750 MG tablet, Take 750 mg by mouth daily as needed for muscle spasms., Disp: , Rfl:  .  Multiple Vitamin (MULTIVITAMIN) tablet, Take 1 tablet by mouth daily., Disp: , Rfl:  .  omeprazole (PRILOSEC) 20 MG capsule, Take 20 mg by mouth daily., Disp: , Rfl:  .  OVER THE COUNTER MEDICATION, Fiber pill, Disp: , Rfl:  .  triamcinolone (NASACORT) 55 MCG/ACT AERO nasal inhaler, Place 2 sprays into the nose daily as needed., Disp: , Rfl:  .  Turmeric 500 MG CAPS, Take by mouth., Disp: , Rfl:   Current Facility-Administered Medications:  .  0.9 %  sodium chloride infusion, 500 mL, Intravenous, Continuous, Danis, Estill Cotta III, MD   Vital signs in last 24 hrs: Vitals:   07/07/20 1317  BP: 110/70  Pulse: 80    Physical Exam  Well-appearing  HEENT: sclera  anicteric, oral mucosa moist without lesions  Neck: supple, no thyromegaly, JVD or lymphadenopathy  Cardiac: RRR without murmurs, S1S2 heard, no peripheral edema  Pulm: clear to auscultation bilaterally, normal RR and effort noted  Abdomen: soft, no tenderness, with active bowel sounds.  Left lobe and liver enlarged as before, no spleen tip palpable, somewhat limited by body habitus.  No obvious ascites on exam, no bulging umbilicus or caput medusae. Skin; warm and dry, no jaundice or rash.  No spider nevi Neuro: Steady gait, normal gross motor function, no asterixis  Labs:  CBC Latest Ref Rng & Units 07/07/2020 06/19/2019 01/15/2019  WBC 4.0 - 10.5 K/uL 5.0 4.4 3.5  Hemoglobin 13.0 - 17.0 g/dL 14.4 14.2 14.3  Hematocrit 39 - 52  % 41.7 41.5 42.4  Platelets 150 - 400 K/uL 101.0(L) 85.0(L) 102(L)   CMP Latest Ref Rng & Units 07/07/2020 06/19/2019 01/15/2019  Glucose 70 - 99 mg/dL 125(H) 129(H) 121(H)  BUN 6 - 23 mg/dL 21 24(H) 17  Creatinine 0.40 - 1.50 mg/dL 0.90 1.00 0.87  Sodium 135 - 145 mEq/L 135 140 141  Potassium 3.5 - 5.1 mEq/L 4.2 4.2 4.5  Chloride 96 - 112 mEq/L 100 104 102  CO2 19 - 32 mEq/L 27 27 22   Calcium 8.4 - 10.5 mg/dL 9.4 9.6 9.4  Total Protein 6.0 - 8.3 g/dL 6.9 6.9 6.8  Total Bilirubin 0.2 - 1.2 mg/dL 0.7 0.5 0.5  Alkaline Phos 39 - 117 U/L 43 43 55  AST 0 - 37 U/L 61(H) 36 69(H)  ALT 0 - 53 U/L 97(H) 41 94(H)   Lab Results  Component Value Date   AFPTM 3.9 06/19/2019   AFPTM 4.8 06/21/2018   AFPTM 3.9 03/09/2016   INR 1.0  ___________________________________________ Radiologic studies:  Screening right upper quadrant ultrasound done this morning, no report yet. ____________________________________________ Other:   _____________________________________________ Assessment & Plan  Assessment: Encounter Diagnoses  Name Primary?  . Liver cirrhosis secondary to NASH ([place]) Yes  . Thrombocytopenia ([place])    Stable [place] related cirrhosis. Low MELD score. Thrombocytopenia with fluctuating values over the last few years but overall stable.  Compensated disease, no known varices, history of GI bleeding, encephalopathy or ascites.   Plan:  Due for screening EGD to rule out varices.  (Last EGD 07/25/2017) He is on the schedule for tomorrow.  Risks and benefits reviewed.  The benefits and risks of the planned procedure were described in detail with the patient or (when appropriate) their health care proxy.  Risks were outlined as including, but not limited to, bleeding, infection, perforation, adverse medication reaction leading to cardiac or pulmonary decompensation, pancreatitis (if ERCP).  The limitation of incomplete mucosal visualization was also discussed.  No guarantees or warranties  were given.  If significant size varices found, will need change to nonselective beta-blocker.  Even if not found, it would be reasonable for his primary care provider to change one of his antihypertensives to a nonselective beta-blocker to decrease the chance of variceal formation in the future, should his portal hypertension progress.  I will make sure he gets a copy of the procedure report so we can bring it to his primary care provider in [place].  30 minutes were spent on this encounter (including chart review, history/exam, counseling/coordination of care, and documentation)  Nelida Meuse III

## 2020-07-07 NOTE — Patient Instructions (Signed)
If you are age 73 or older, your body mass index should be between 23-30. Your Body mass index is 31.28 kg/m. If this is out of the aforementioned range listed, please consider follow up with your Primary Care Provider.  If you are age 37 or younger, your body mass index should be between 19-25. Your Body mass index is 31.28 kg/m. If this is out of the aformentioned range listed, please consider follow up with your Primary Care Provider.   You have been scheduled for an endoscopy. Please follow written instructions given to you at your visit today. If you use inhalers (even only as needed), please bring them with you on the day of your procedure.  It was a pleasure to see you today!  Dr. [name]

## 2020-07-08 ENCOUNTER — Ambulatory Visit (AMBULATORY_SURGERY_CENTER): Payer: Medicare Other | Admitting: Gastroenterology

## 2020-07-08 ENCOUNTER — Encounter: Payer: Self-pay | Admitting: Gastroenterology

## 2020-07-08 VITALS — BP 112/70 | HR 66 | Temp 97.4°F | Resp 16 | Ht 70.0 in | Wt 218.0 lb

## 2020-07-08 DIAGNOSIS — K7581 Nonalcoholic steatohepatitis (NASH): Secondary | ICD-10-CM

## 2020-07-08 DIAGNOSIS — K7460 Unspecified cirrhosis of liver: Secondary | ICD-10-CM

## 2020-07-08 DIAGNOSIS — K760 Fatty (change of) liver, not elsewhere classified: Secondary | ICD-10-CM | POA: Diagnosis not present

## 2020-07-08 MED ORDER — SODIUM CHLORIDE 0.9 % IV SOLN
500.0000 mL | Freq: Once | INTRAVENOUS | Status: DC
Start: 2020-07-08 — End: 2020-07-08

## 2020-07-08 NOTE — Progress Notes (Signed)
To PACU, VSS. Report to [email or web address]

## 2020-07-08 NOTE — Op Note (Signed)
[facility] Patient Name: Jeffery Pruitt Procedure Date: 07/08/2020 3:46 PM MRN: [id number] Endoscopist: Mallie Mussel L. [name] , MD Age: 74 Referring MD:  Date of Birth: 05-May-1946 Gender: Male Account #: [id number] Procedure:                Upper GI endoscopy Indications:              Cirrhosis rule out esophageal varices (NASH                            cirrhosis, chronic stable thrombocytopenia, no                            varices 2018) Medicines:                Monitored Anesthesia Care Procedure:                Pre-Anesthesia Assessment:                           - Prior to the procedure, a History and Physical                            was performed, and patient medications and                            allergies were reviewed. The patient's tolerance of                            previous anesthesia was also reviewed. The risks                            and benefits of the procedure and the sedation                            options and risks were discussed with the patient.                            All questions were answered, and informed consent                            was obtained. Prior Anticoagulants: The patient has                            taken no previous anticoagulant or antiplatelet                            agents. ASA Grade Assessment: III - A patient with                            severe systemic disease. After reviewing the risks                            and benefits, the patient was deemed in  satisfactory condition to undergo the procedure.                           After obtaining informed consent, the endoscope was                            passed under direct vision. Throughout the                            procedure, the patient's blood pressure, pulse, and                            oxygen saturations were monitored continuously. The                            Endoscope was introduced through the mouth, and                             advanced to the second part of duodenum. The upper                            GI endoscopy was accomplished without difficulty.                            The patient tolerated the procedure well. Scope In: Scope Out: Findings:                 The examined esophagus was normal.                           There is no endoscopic evidence of varices in the                            entire esophagus.                           The stomach was normal.                           There is no endoscopic evidence of varices in the                            cardia and in the gastric fundus.                           The examined duodenum was normal. Complications:            No immediate complications. Estimated Blood Loss:     Estimated blood loss: none. Impression:               - Normal esophagus.                           - Normal stomach.                           - Normal examined duodenum.                           -  No specimens collected. Recommendation:           - Patient has a contact number available for                            emergencies. The signs and symptoms of potential                            delayed complications were discussed with the                            patient. Return to normal activities tomorrow.                            Written discharge instructions were provided to the                            patient.                           - Resume previous diet.                           - Continue present medications.                           - Repeat upper endoscopy in 3 years for screening                            purposes.                           - Although patient does not have varices, I have                            encouraged him to have a discussion with primary                            care about changing his current anti-hypertensive                            medicine to a non-selective beta blocker (i.e.                             nadolol, propranolol, carvedilol) in case portal                            hypertension should progress in the future. This                            would decrease the chance of variceal development. Verlyn Lambert L. [name], MD 07/08/2020 4:18:30 PM This report has been signed electronically.

## 2020-07-08 NOTE — Patient Instructions (Signed)
YOU HAD AN ENDOSCOPIC PROCEDURE TODAY AT THE [facility]:   Refer to the procedure report that was given to you for any specific questions about what was found during the examination.  If the procedure report does not answer your questions, please call your gastroenterologist to clarify.  If you requested that your care partner not be given the details of your procedure findings, then the procedure report has been included in a sealed envelope for you to review at your convenience later.  YOU SHOULD EXPECT: Some feelings of bloating in the abdomen. Passage of more gas than usual.  Walking can help get rid of the air that was put into your GI tract during the procedure and reduce the bloating. If you had a lower endoscopy (such as a colonoscopy or flexible sigmoidoscopy) you may notice spotting of blood in your stool or on the toilet paper. If you underwent a bowel prep for your procedure, you may not have a normal bowel movement for a few days.  Please Note:  You might notice some irritation and congestion in your nose or some drainage.  This is from the oxygen used during your procedure.  There is no need for concern and it should clear up in a day or so.  SYMPTOMS TO REPORT IMMEDIATELY:    Following upper endoscopy (EGD)  Vomiting of blood or coffee ground material  New chest pain or pain under the shoulder blades  Painful or persistently difficult swallowing  New shortness of breath  Fever of 100F or higher  Black, tarry-looking stools  For urgent or emergent issues, a gastroenterologist can be reached at any hour by calling [phone]. Do not use MyChart messaging for urgent concerns.    DIET:  We do recommend a small meal at first, but then you may proceed to your regular diet.  Drink plenty of fluids but you should avoid alcoholic beverages for 24 hours.  ACTIVITY:  You should plan to take it easy for the rest of today and you should NOT DRIVE or use heavy machinery  until tomorrow (because of the sedation medicines used during the test).    FOLLOW UP: Our staff will call the number listed on your records 48-72 hours following your procedure to check on you and address any questions or concerns that you may have regarding the information given to you following your procedure. If we do not reach you, we will leave a message.  We will attempt to reach you two times.  During this call, we will ask if you have developed any symptoms of COVID 19. If you develop any symptoms (ie: fever, flu-like symptoms, shortness of breath, cough etc.) before then, please call [phone].  If you test positive for Covid 19 in the 2 weeks post procedure, please call and report this information to us.    If any biopsies were taken you will be contacted by phone or by letter within the next 1-3 weeks.  Please call us at [phone] if you have not heard about the biopsies in 3 weeks.    SIGNATURES/CONFIDENTIALITY: You and/or your care partner have signed paperwork which will be entered into your electronic medical record.  These signatures attest to the fact that that the information above on your After Visit Summary has been reviewed and is understood.  Full responsibility of the confidentiality of this discharge information lies with you and/or your care-partner. 

## 2020-07-08 NOTE — Progress Notes (Signed)
[facility]

## 2020-07-10 ENCOUNTER — Telehealth: Payer: Self-pay

## 2020-07-10 NOTE — Telephone Encounter (Signed)
  Follow up Call-  Call back number 07/08/2020  Post procedure Call Back phone  # [phone]  Permission to leave phone message Yes  Some recent data might be hidden     Patient questions:  Do you have a fever, pain , or abdominal swelling? No. Pain Score  0 *  Have you tolerated food without any problems? Yes.    Have you been able to return to your normal activities? Yes.    Do you have any questions about your discharge instructions: Diet   No. Medications  No. Follow up visit  No.  Do you have questions or concerns about your Care? No.  Actions: * If pain score is 4 or above: No action needed, pain <4.  1. Have you developed a fever since your procedure? no  2.   Have you had an respiratory symptoms (SOB or cough) since your procedure? no  3.   Have you tested positive for COVID 19 since your procedure no  4.   Have you had any family members/close contacts diagnosed with the COVID 19 since your procedure?  no   If yes to any of these questions please route to Joylene John, RN and Joella Prince, RN

## 2021-03-08 IMAGING — US ULTRASOUND ABDOMEN COMPLETE
1 series · 14 of 25 positions shown · non-contrast
Comparison: Ultrasound June 21, 2018.

CLINICAL DATA: Hepatic cirrhosis.

EXAM:
ABDOMEN ULTRASOUND COMPLETE

[Series 1: ultrasound abdomen complete · 14 of 37 slices shown]
[im 1/37]
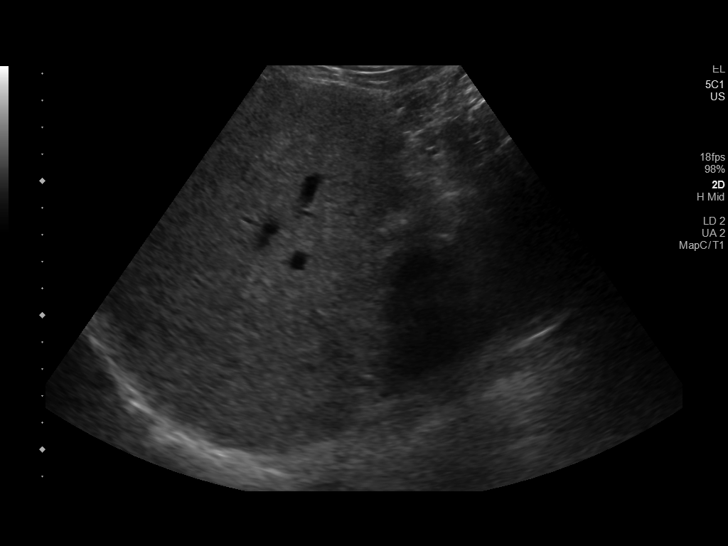
[im 4/37]
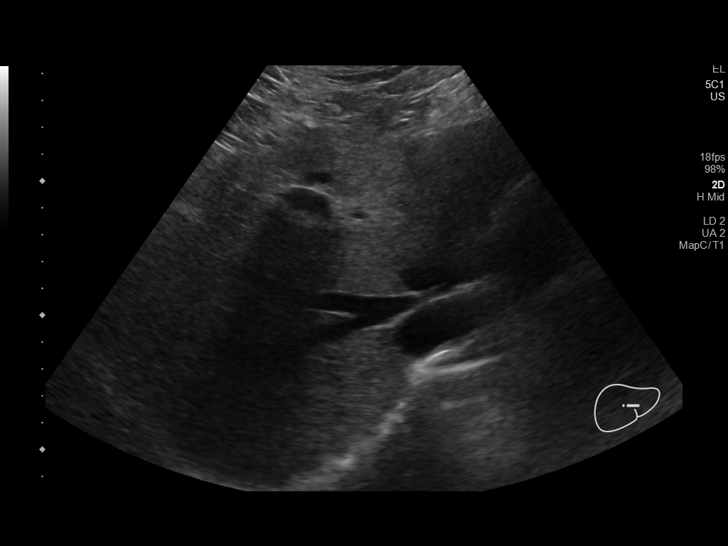
[im 7/37]
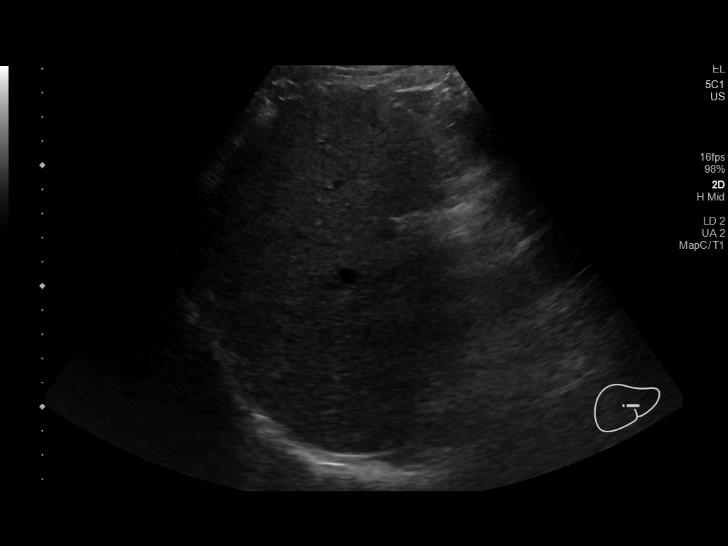
[im 10/37]
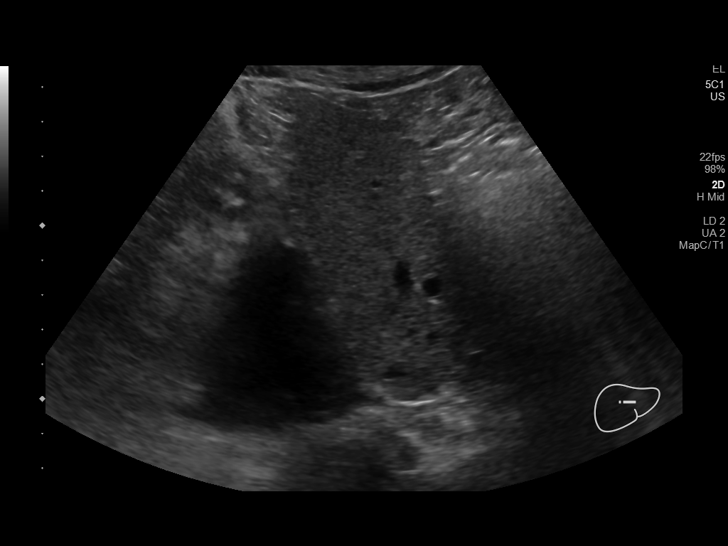
[im 13/37]
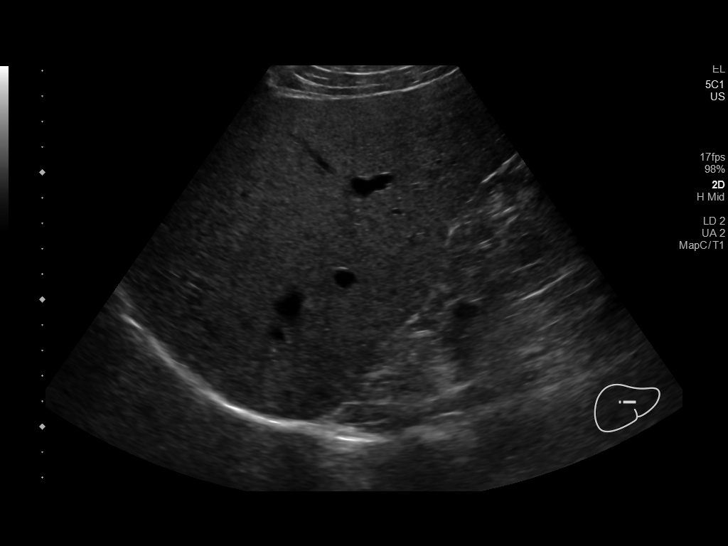
[im 14/37]
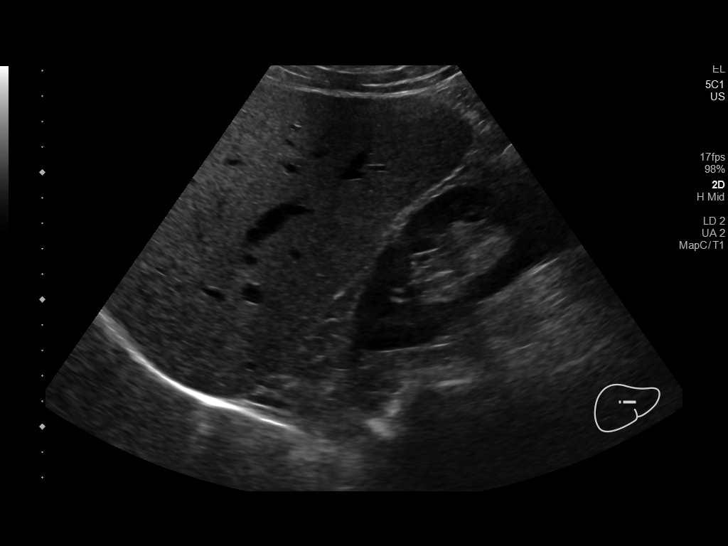
[im 17/37]
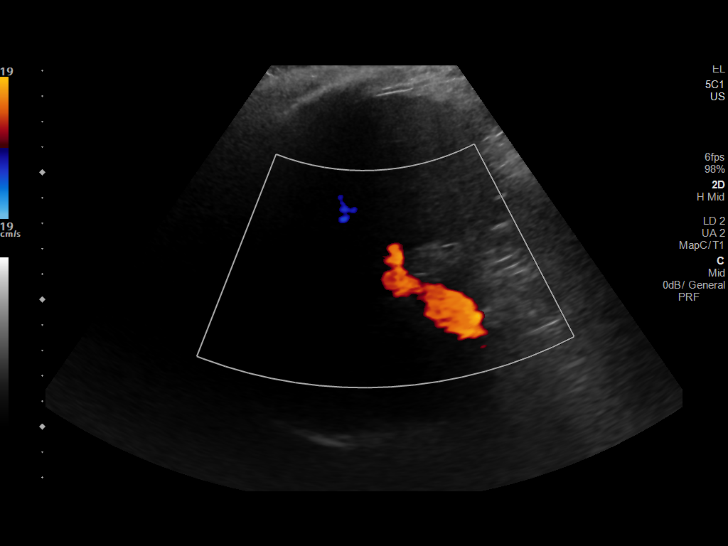
[im 20/37]
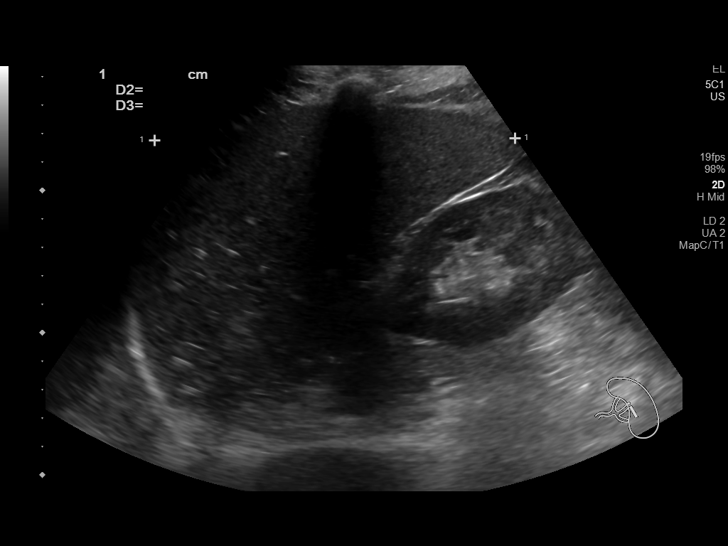
[im 23/37]
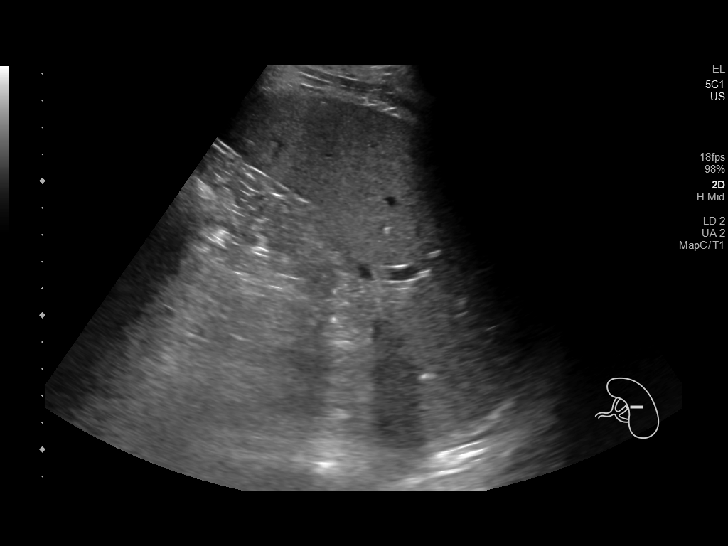
[im 25/37]
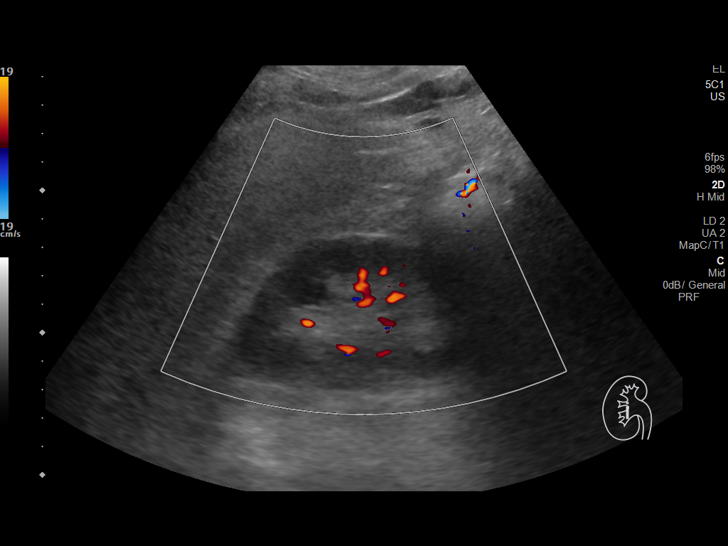
[im 28/37]
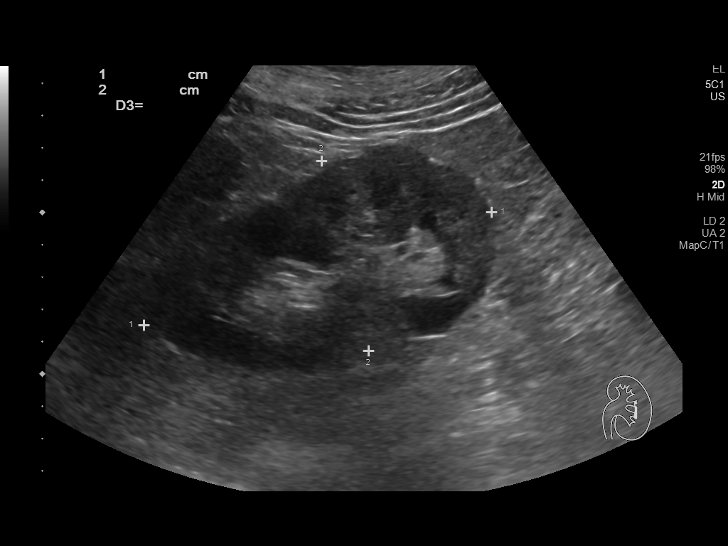
[im 31/37]
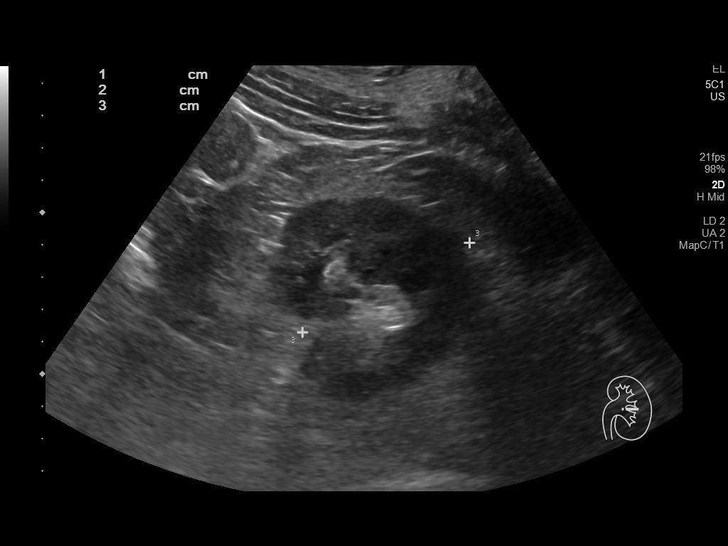
[im 34/37]
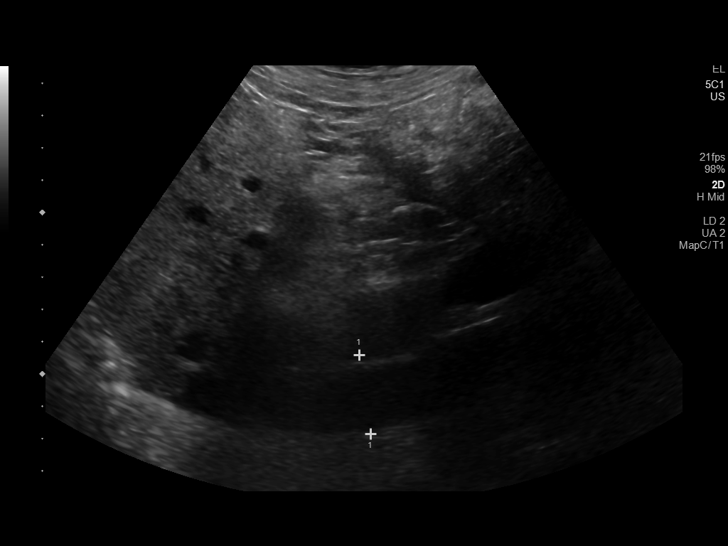
[im 37/37]
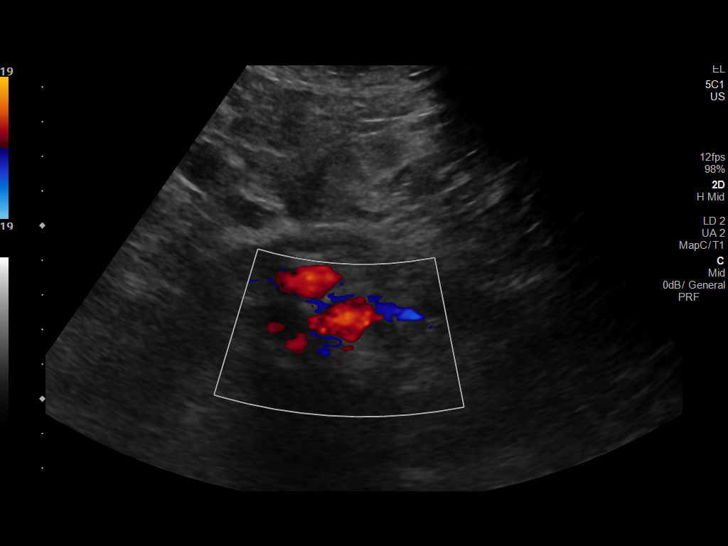

[14 of 25 positions shown; findings below may reference images not displayed]

FINDINGS: Gallbladder: Status post cholecystectomy.

Common bile duct: Diameter: 4 mm which is within normal limits.

Liver: No focal lesion identified. Heterogeneous echotexture of
hepatic parenchyma is noted with nodular contours suggesting hepatic
cirrhosis. Portal vein is patent on color Doppler imaging with
normal direction of blood flow towards the liver.

IVC: No abnormality visualized.

Pancreas: Visualized portion unremarkable.

Spleen: Size and appearance within normal limits.

Right Kidney: Length: 11.7 cm. Echogenicity within normal limits. No
mass or hydronephrosis visualized.

Left Kidney: Length: 11.3 cm. Echogenicity within normal limits. No
mass or hydronephrosis visualized.

Abdominal aorta: No aneurysm visualized.

Other findings: None.
IMPRESSION: Stable findings consistent with hepatic cirrhosis. No definite focal
sonographic hepatic abnormality is noted. Status post
cholecystectomy. No other abnormality seen in the abdomen.

## 2021-05-18 ENCOUNTER — Telehealth: Payer: Self-pay | Admitting: Gastroenterology

## 2021-05-18 DIAGNOSIS — K7581 Nonalcoholic steatohepatitis (NASH): Secondary | ICD-10-CM

## 2021-05-18 DIAGNOSIS — K760 Fatty (change of) liver, not elsewhere classified: Secondary | ICD-10-CM

## 2021-05-18 DIAGNOSIS — K7460 Unspecified cirrhosis of liver: Secondary | ICD-10-CM

## 2021-05-18 NOTE — Telephone Encounter (Signed)
Dr. [name], please see note below and advise. Thanks

## 2021-05-18 NOTE — Telephone Encounter (Signed)
Patient called to schedule an appt on 06/24/21 with Dr. [name] for cirrhosis and stated he usually has an ultrasound and labs done in the morning on the day of his appt.  Please advise.

## 2021-05-18 NOTE — Telephone Encounter (Signed)
This patient has Jeffery Pruitt related cirrhosis, and sees me once or twice a year.  He lives over 2 hours away from here, so schedule some testing for the same day.  The morning of his visit (as early an appointment is possible), please have him get a right upper quadrant ultrasound.  Indication is cirrhosis, screen for HCC.  That morning he also needs the following labs:  CBC, CMP, PT/INR, AFP

## 2021-05-19 NOTE — Telephone Encounter (Signed)
Lab orders in epic.  Patient has been scheduled for a RUQ [place] at [facility] on Wednesday, 06/24/21 at 9:30 am, arriving at 9:15 am. NPO after midnight.   Patient notified of information via my chart.

## 2021-06-24 ENCOUNTER — Ambulatory Visit (HOSPITAL_COMMUNITY)
Admission: RE | Admit: 2021-06-24 | Discharge: 2021-06-24 | Disposition: A | Discharge status: Home / Self Care | Payer: Medicare Other | Source: Ambulatory Visit | Attending: Gastroenterology | Admitting: Gastroenterology

## 2021-06-24 ENCOUNTER — Encounter: Payer: Self-pay | Admitting: Gastroenterology

## 2021-06-24 ENCOUNTER — Ambulatory Visit (INDEPENDENT_AMBULATORY_CARE_PROVIDER_SITE_OTHER): Payer: Medicare Other | Admitting: Gastroenterology

## 2021-06-24 ENCOUNTER — Other Ambulatory Visit: Payer: Self-pay

## 2021-06-24 ENCOUNTER — Other Ambulatory Visit (INDEPENDENT_AMBULATORY_CARE_PROVIDER_SITE_OTHER): Payer: Medicare Other

## 2021-06-24 VITALS — BP 108/68 | HR 47 | Ht 70.0 in | Wt 218.0 lb

## 2021-06-24 DIAGNOSIS — D696 Thrombocytopenia, unspecified: Secondary | ICD-10-CM | POA: Diagnosis not present

## 2021-06-24 DIAGNOSIS — K7460 Unspecified cirrhosis of liver: Secondary | ICD-10-CM

## 2021-06-24 DIAGNOSIS — K7581 Nonalcoholic steatohepatitis (NASH): Secondary | ICD-10-CM

## 2021-06-24 DIAGNOSIS — K760 Fatty (change of) liver, not elsewhere classified: Secondary | ICD-10-CM

## 2021-06-24 LAB — CBC WITH DIFFERENTIAL/PLATELET
Basophils Absolute: 0 10*3/uL (ref 0.0–0.1)
Basophils Relative: 0.5 % (ref 0.0–3.0)
Eosinophils Absolute: 0.1 10*3/uL (ref 0.0–0.7)
Eosinophils Relative: 3.9 % (ref 0.0–5.0)
HCT: 40.1 % (ref 39.0–52.0)
Hemoglobin: 13.7 g/dL (ref 13.0–17.0)
Lymphocytes Relative: 30.2 % (ref 12.0–46.0)
Lymphs Abs: 1 10*3/uL (ref 0.7–4.0)
MCHC: 34.2 g/dL (ref 30.0–36.0)
MCV: 91.3 fl (ref 78.0–100.0)
Monocytes Absolute: 0.3 10*3/uL (ref 0.1–1.0)
Monocytes Relative: 9.3 % (ref 3.0–12.0)
Neutro Abs: 1.9 10*3/uL (ref 1.4–7.7)
Neutrophils Relative %: 56.1 % (ref 43.0–77.0)
Platelets: 84 10*3/uL — ABNORMAL LOW (ref 150.0–400.0)
RBC: 4.39 Mil/uL (ref 4.22–5.81)
RDW: 14.4 % (ref 11.5–15.5)
WBC: 3.4 10*3/uL — ABNORMAL LOW (ref 4.0–10.5)

## 2021-06-24 LAB — COMPREHENSIVE METABOLIC PANEL
ALT: 56 U/L — ABNORMAL HIGH (ref 0–53)
AST: 43 U/L — ABNORMAL HIGH (ref 0–37)
Albumin: 4.4 g/dL (ref 3.5–5.2)
Alkaline Phosphatase: 56 U/L (ref 39–117)
BUN: 16 mg/dL (ref 6–23)
CO2: 26 mEq/L (ref 19–32)
Calcium: 9.7 mg/dL (ref 8.4–10.5)
Chloride: 102 mEq/L (ref 96–112)
Creatinine, Ser: 0.96 mg/dL (ref 0.40–1.50)
GFR: 77.9 mL/min (ref >60.00)
Glucose, Bld: 123 mg/dL — ABNORMAL HIGH (ref 70–99)
Potassium: 4.3 mEq/L (ref 3.5–5.1)
Sodium: 137 mEq/L (ref 135–145)
Total Bilirubin: 0.9 mg/dL (ref 0.2–1.2)
Total Protein: 7.1 g/dL (ref 6.0–8.3)

## 2021-06-24 LAB — PROTIME-INR
INR: 1.1 ratio — ABNORMAL HIGH (ref 0.8–1.0)
Prothrombin Time: 12.7 s (ref 9.6–13.1)

## 2021-06-24 NOTE — Progress Notes (Addendum)
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 Last EGD to screen for varices 07/08/2020, no gastric or esophageal varices seen.  Nevertheless, I recommended he have a conversation with primary care to change his antihypertensive to a nonselective beta-blocker to decrease the chance of variceal development if there is progression of his liver disease.  He was changed to carvedilol, but at escalating doses need to control his hypertension he then developed peripheral edema.  Carvedilol dose was then decreased to 3.125 mg twice daily and losartan begun, which has apparently given good control and resolution of edema.  He denies abdominal pain, nausea vomiting altered bowel habits rectal bleeding.  He has put on 10 to 12 pounds in the last 12 to 18 months, which he attributes to consuming too many carbs. He and his wife again had a terrific winter traveling to [place] and then periodically to the [facility] area to stay with children and grandchildren.  ROS: Cardiovascular:  no chest pain Respiratory: no dyspnea  The patient's Past Medical, Family and Social History were reviewed and are on file in the EMR.  Objective:  Med list reviewed  Current Outpatient Medications:    ascorbic acid (VITAMIN C) 1000 MG tablet, Take 1 tablet by mouth daily., Disp: , Rfl:    atorvastatin (LIPITOR) 10 MG tablet, TAKE 1 TABLET EVERY DAY  AT  6PM, Disp: 90 tablet, Rfl: 0   carvedilol (COREG) 3.125 MG tablet, Take by mouth 2 (two) times daily., Disp: , Rfl:    Cholecalciferol (VITAMIN D-3) 25 MCG (1000 UT) CAPS, Take by mouth., Disp: , Rfl:    cyanocobalamin 1000 MCG tablet, Take 500 mcg by mouth daily., Disp: , Rfl:    cyclobenzaprine (FLEXERIL) 10 MG tablet, Take 10 mg by mouth daily as needed for muscle spasms., Disp: , Rfl:    diclofenac Sodium (VOLTAREN) 1 % GEL, Apply topically., Disp: , Rfl:     fexofenadine (ALLEGRA) 180 MG tablet, Take 180 mg by mouth daily as needed for allergies or rhinitis., Disp: , Rfl:    Fiber Complete TABS, Take by mouth., Disp: , Rfl:    ibuprofen (ADVIL) 800 MG tablet, Take 800 mg by mouth as needed., Disp: , Rfl:    losartan (COZAAR) 50 MG tablet, Take by mouth daily., Disp: , Rfl:    Magnesium 250 MG TABS, Take 2 tablets by mouth in the morning and at bedtime., Disp: , Rfl:    meloxicam (MOBIC) 15 MG tablet, Take 15 mg by mouth daily as needed for pain., Disp: , Rfl:    methocarbamol (ROBAXIN) 750 MG tablet, Take 750 mg by mouth as needed for muscle spasms., Disp: , Rfl:    Multiple Vitamins-Minerals (CENTRUM SILVER 50+MEN) TABS, Take 1 tablet by mouth daily., Disp: , Rfl:    omeprazole (PRILOSEC) 20 MG capsule, Take 20 mg by mouth daily as needed., Disp: , Rfl:    triamcinolone (NASACORT) 55 MCG/ACT AERO nasal inhaler, Place 2 sprays into the nose daily as needed., Disp: , Rfl:    Turmeric 500 MG CAPS, Take by mouth., Disp: , Rfl:    Vital signs in last 24 hrs: Vitals:   06/24/21 1529  BP: 108/68  Pulse: (!) 47  SpO2: 96%   Wt Readings from Last 3 Encounters:  06/24/21 218 lb (98.9 kg)  07/08/20 218 lb (98.9 kg)  07/07/20 218 lb (98.9 kg)  Weight  stable from visit a year ago.  Physical Exam  Well-appearing HEENT: sclera anicteric, oral mucosa moist without lesions Neck: supple, no thyromegaly, JVD or lymphadenopathy Cardiac: RRR without murmurs, 3 out of 6 systolic murmur (probably AS), no peripheral edema Pulm: clear to auscultation bilaterally, normal RR and effort noted Abdomen: soft, no tenderness, with active bowel sounds. No guarding or palpable hepatosplenomegaly.  Overweight Skin; warm and dry, no jaundice or rash  Labs:  CBC Latest Ref Rng & Units 06/24/2021 07/07/2020 06/19/2019  WBC 4.0 - 10.5 K/uL 3.4(L) 5.0 4.4  Hemoglobin 13.0 - 17.0 g/dL 13.7 14.4 14.2  Hematocrit 39.0 - 52.0 % 40.1 41.7 41.5  Platelets 150.0 - 400.0  K/uL 84.0(L) 101.0(L) 85.0(L)   CMP Latest Ref Rng & Units 06/24/2021 07/07/2020 06/19/2019  Glucose 70 - 99 mg/dL 123(H) 125(H) 129(H)  BUN 6 - 23 mg/dL 16 21 24(H)  Creatinine 0.40 - 1.50 mg/dL 0.96 0.90 1.00  Sodium 135 - 145 mEq/L 137 135 140  Potassium 3.5 - 5.1 mEq/L 4.3 4.2 4.2  Chloride 96 - 112 mEq/L 102 100 104  CO2 19 - 32 mEq/L 26 27 27   Calcium 8.4 - 10.5 mg/dL 9.7 9.4 9.6  Total Protein 6.0 - 8.3 g/dL 7.1 6.9 6.9  Total Bilirubin 0.2 - 1.2 mg/dL 0.9 0.7 0.5  Alkaline Phos 39 - 117 U/L 56 43 43  AST 0 - 37 U/L 43(H) 61(H) 36  ALT 0 - 53 U/L 56(H) 97(H) 41   AFP pending  INR 1.1   ___________________________________________ Radiologic studies:  RUQ U/S done earlier today but no report yet ____________________________________________ Other:   _____________________________________________ Assessment & Plan  Assessment: Encounter Diagnoses  Name Primary?   Liver cirrhosis secondary to NASH (Start) Yes   Fatty liver    Thrombocytopenia (HCC)     Stable [place] related cirrhosis with minimal LFT elevation and chronic stable thrombocytopenia. We discussed the need for carbohydrate reduction both for control of weight and fatty liver. ___________________________________________________ Hepatoma screening: Up-to-date I reviewed that the diagnosis of cirrhosis requires liver cancer screening every 6 months with ultrasound and alpha feto-protein lab.  Ascites: None I reviewed signs and symptoms of ascites and SBP.  Variceal Screening: Up-to-date  Hepatic Encephalopathy: None I reviewed signs/symptoms of encephalopathy and reason to contact my office.  Nutrition:  High protein diet from a primarily plant-based diet.  Avoid red meat.  No raw or undercooked meat, seafood or shellfish.  Low fat/cholesterol/carbohydrate diet.  Limit sodium to 2000 mg/day.  Recommend daily multivitamin.  Recommend 30 minutes of aerobic exercise three days/week if  able.  Immunization: Hepatitis A: Done previously Hepatitis B: Done previously Advised to see primary care for pneumococcal vaccine and annual influenza vaccine.  Plan: No further testing or changes in management at this point  I will send him a portal message with results of AFP and ultrasound.  I will see him in a year or sooner as needed.  He contacts me over the winter when he is traveling, and also gets lab work done with a PCP in that area.  20 minutes were spent on this encounter (including chart review, history/exam, counseling/coordination of care, and documentation) > 50% of that time was spent on counseling and coordination of care.  Topics discussed included: See above.  Nelida Meuse III

## 2021-06-24 NOTE — Patient Instructions (Signed)
If you are age 74 or older, your body mass index should be between 23-30. Your Body mass index is 31.28 kg/m. If this is out of the aforementioned range listed, please consider follow up with your Primary Care Provider.  If you are age 35 or younger, your body mass index should be between 19-25. Your Body mass index is 31.28 kg/m. If this is out of the aformentioned range listed, please consider follow up with your Primary Care Provider.   __________________________________________________________  The [facility] providers would like to encourage you to use [facility] to communicate with providers for non-urgent requests or questions.  Due to long hold times on the telephone, sending your provider a message by [facility] may be a faster and more efficient way to get a response.  Please allow 48 business hours for a response.  Please remember that this is for non-urgent requests.   It was a pleasure to see you today!  Thank you for trusting me with your gastrointestinal care!

## 2021-06-25 LAB — AFP TUMOR MARKER: AFP-Tumor Marker: 5.1 ng/mL (ref <6.1)

## 2021-07-26 ENCOUNTER — Encounter: Payer: Self-pay | Admitting: Gastroenterology

## 2022-04-14 ENCOUNTER — Encounter: Payer: Self-pay | Admitting: Gastroenterology

## 2022-04-15 NOTE — Telephone Encounter (Signed)
Please let Jeffery Pruitt know it has been my pleasure to care for him over the years and fax the following records to his new GI physician with the contact info provided:  My office notes dating back to 2017 Upper endoscopy reports from 2021 and 2018 Right upper quadrant ultrasound report from July 2022  Also, if Jeffery Pruitt does not already have [facility] MyChart access, he should consider getting it so he can have access to all of his [facility] records in case his new providers in [place] need them.  Best,  HD

## 2022-04-15 NOTE — Telephone Encounter (Signed)
Referral, records, pt's demographic and insurance information have been faxed to [facility] per pt request.  P: [phone] F: [phone]

## 2023-03-14 IMAGING — US US ABDOMEN LIMITED
1 series · 14 of 25 positions shown · non-contrast
Comparison: July 07, 2020

CLINICAL DATA: Liver cirrhosis.  Prior cholecystectomy.

EXAM:
ULTRASOUND ABDOMEN LIMITED RIGHT UPPER QUADRANT

[Series 1: us abdomen limited · 14 of 27 slices shown]
[im 1/27]
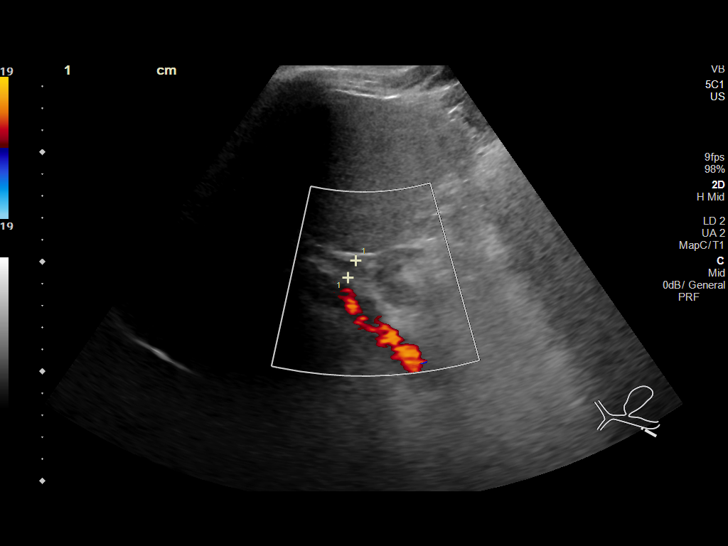
[im 3/27]
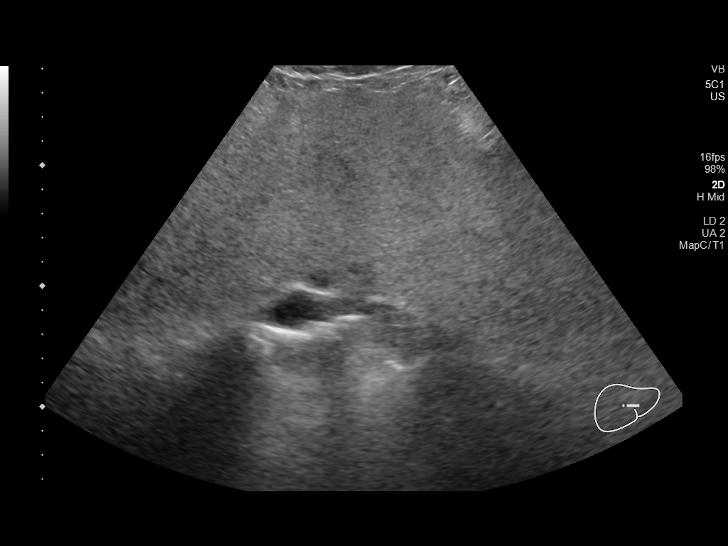
[im 5/27]
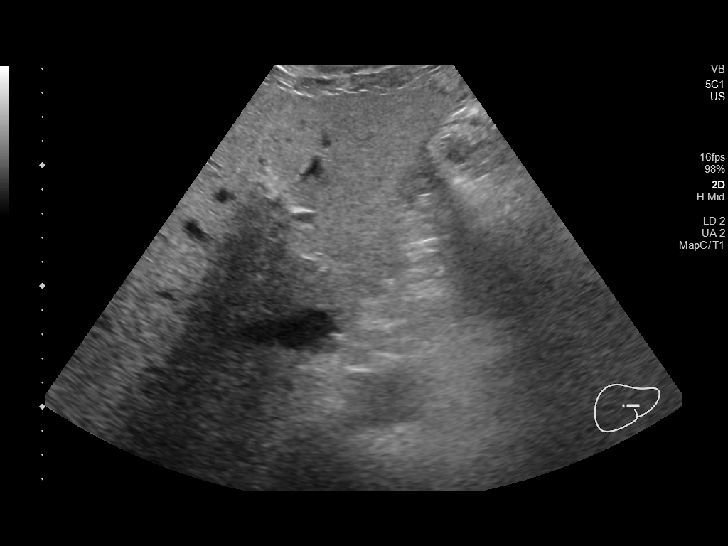
[im 7/27]
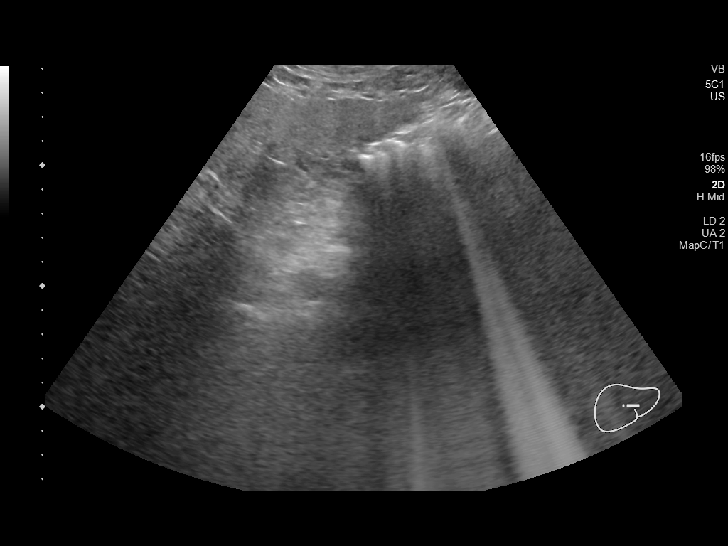
[im 9/27]
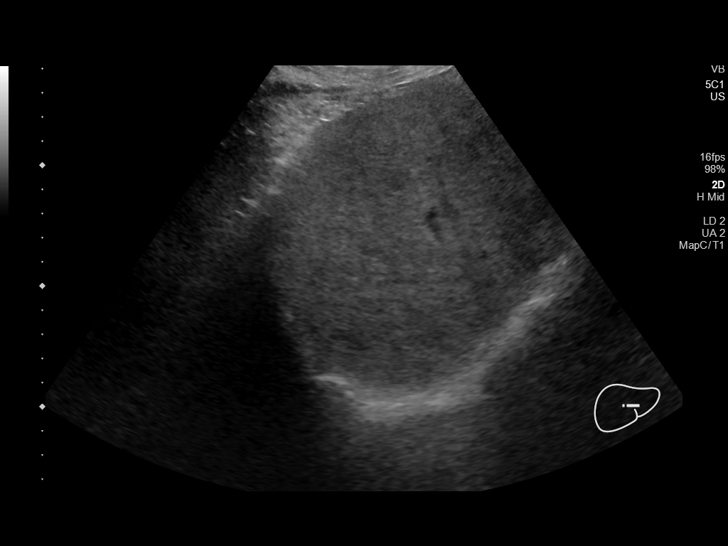
[im 10/27]
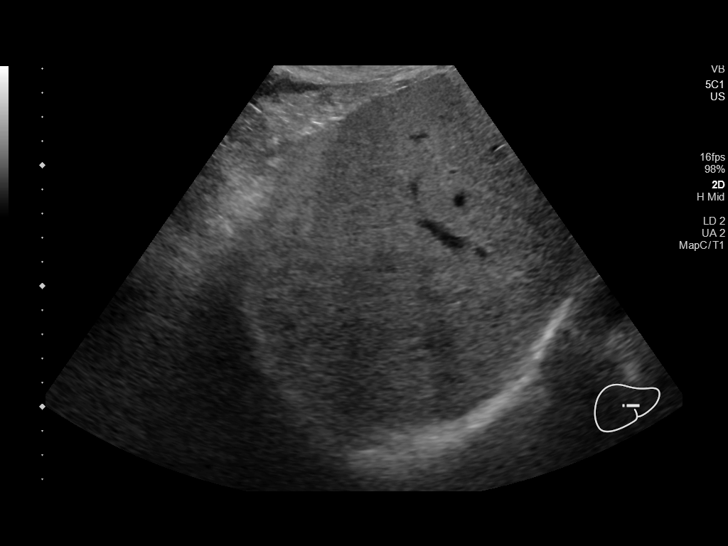
[im 12/27]
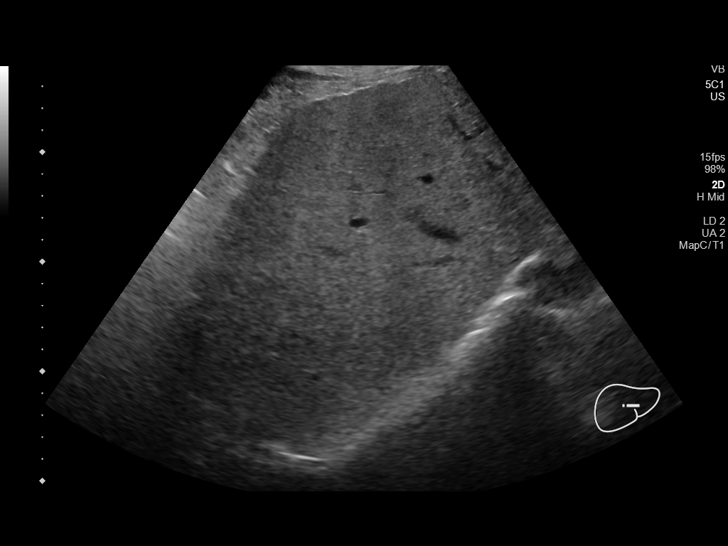
[im 15/27]
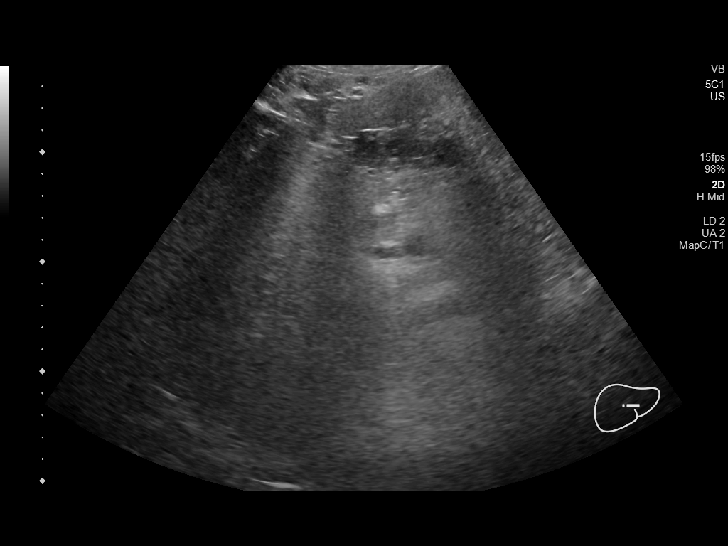
[im 17/27]
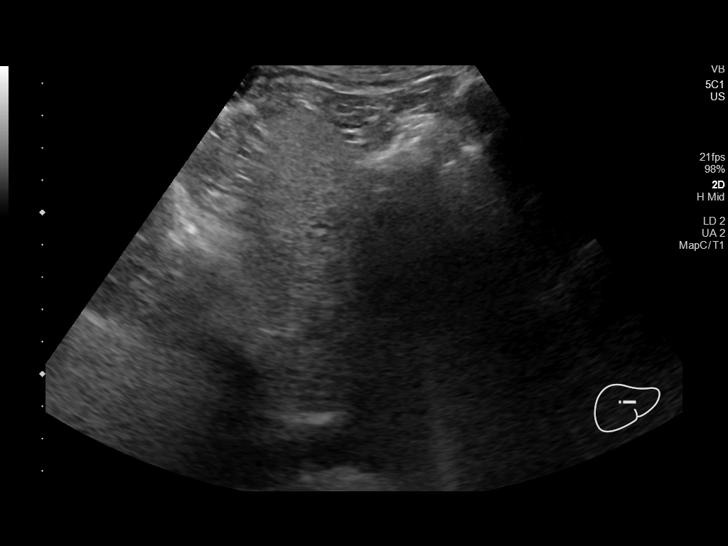
[im 18/27]
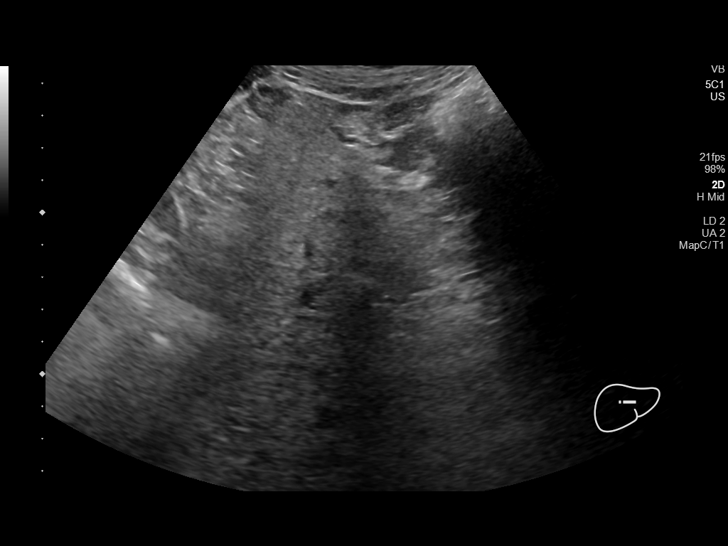
[im 20/27]
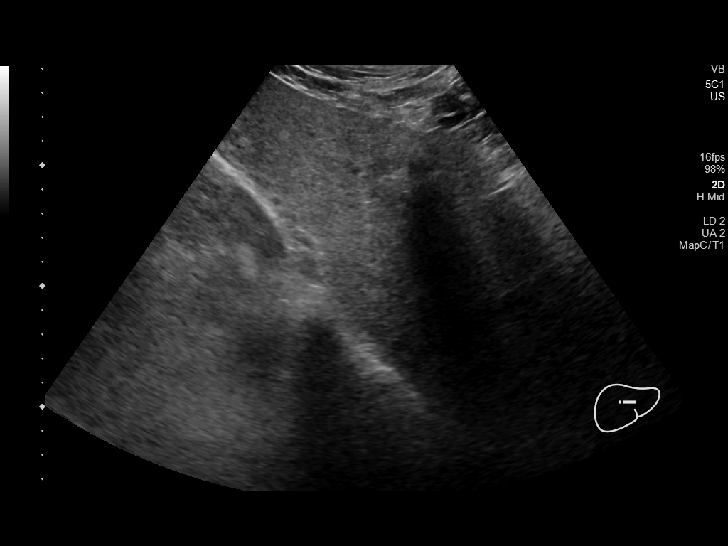
[im 22/27]
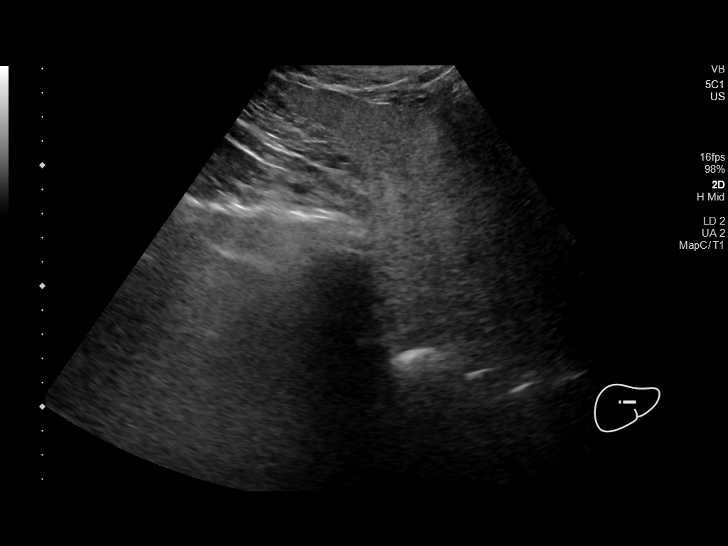
[im 24/27]
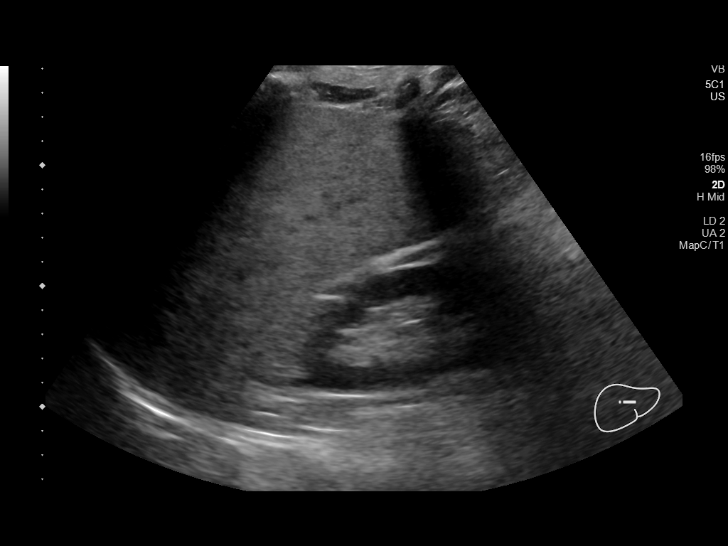
[im 27/27]
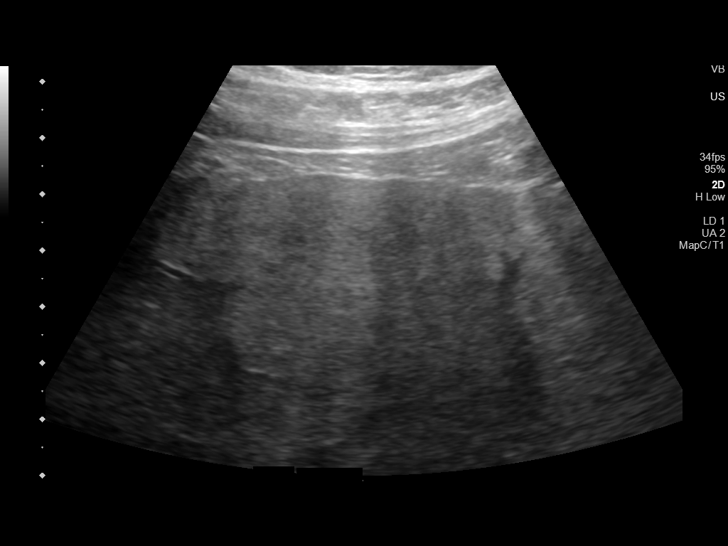

[14 of 25 positions shown; findings below may reference images not displayed]

FINDINGS: Gallbladder:

The gallbladder is surgically absent.

Common bile duct:

Diameter: 8.6 mm

Liver:

No focal lesion identified. The liver parenchyma is diffusely
increased in echogenicity and nodular in echotexture. Portal vein is
patent on color Doppler imaging with normal direction of blood flow
towards the liver.

Other: None.
IMPRESSION: 1. Evidence of prior cholecystectomy.
2. Fatty liver with additional findings suggestive of hepatic
cirrhosis.
